# Patient Record
Sex: Female | Born: 1965
Health system: Southern US, Community
[De-identification: ages and names within clinical notes are randomized; demographics above are authoritative.]

## PROBLEM LIST (undated history)

## (undated) DIAGNOSIS — T7840XA Allergy, unspecified, initial encounter: Secondary | ICD-10-CM

## (undated) DIAGNOSIS — I341 Nonrheumatic mitral (valve) prolapse: Secondary | ICD-10-CM

## (undated) DIAGNOSIS — R011 Cardiac murmur, unspecified: Secondary | ICD-10-CM

## (undated) DIAGNOSIS — K219 Gastro-esophageal reflux disease without esophagitis: Secondary | ICD-10-CM

## (undated) HISTORY — DX: Cardiac murmur, unspecified: R01.1

## (undated) HISTORY — PX: BREAST SURGERY: SHX581

## (undated) HISTORY — DX: Gastro-esophageal reflux disease without esophagitis: K21.9

## (undated) HISTORY — DX: Nonrheumatic mitral (valve) prolapse: I34.1

## (undated) HISTORY — DX: Allergy, unspecified, initial encounter: T78.40XA

---

## 1998-05-25 ENCOUNTER — Encounter: Admission: RE | Admit: 1998-05-25 | Discharge: 1998-08-23 | Payer: Self-pay | Admitting: Family Medicine

## 1998-07-02 ENCOUNTER — Other Ambulatory Visit: Admission: RE | Admit: 1998-07-02 | Discharge: 1998-07-02 | Payer: Self-pay | Admitting: Obstetrics & Gynecology

## 1999-03-03 ENCOUNTER — Inpatient Hospital Stay (HOSPITAL_COMMUNITY): Admission: AD | Admit: 1999-03-03 | Discharge: 1999-03-05 | Payer: Self-pay | Admitting: Family Medicine

## 1999-10-21 ENCOUNTER — Other Ambulatory Visit: Admission: RE | Admit: 1999-10-21 | Discharge: 1999-10-21 | Payer: Self-pay | Admitting: Obstetrics & Gynecology

## 2001-03-05 ENCOUNTER — Other Ambulatory Visit: Admission: RE | Admit: 2001-03-05 | Discharge: 2001-03-05 | Payer: Self-pay | Admitting: Obstetrics & Gynecology

## 2002-03-28 ENCOUNTER — Other Ambulatory Visit: Admission: RE | Admit: 2002-03-28 | Discharge: 2002-03-28 | Payer: Self-pay | Admitting: Obstetrics & Gynecology

## 2003-05-12 ENCOUNTER — Other Ambulatory Visit: Admission: RE | Admit: 2003-05-12 | Discharge: 2003-05-12 | Payer: Self-pay | Admitting: Obstetrics & Gynecology

## 2004-08-15 ENCOUNTER — Other Ambulatory Visit: Admission: RE | Admit: 2004-08-15 | Discharge: 2004-08-15 | Payer: Self-pay | Admitting: Obstetrics & Gynecology

## 2006-07-03 ENCOUNTER — Inpatient Hospital Stay (HOSPITAL_COMMUNITY): Admission: AD | Admit: 2006-07-03 | Discharge: 2006-07-05 | Payer: Self-pay | Admitting: Obstetrics & Gynecology

## 2012-12-27 ENCOUNTER — Ambulatory Visit (INDEPENDENT_AMBULATORY_CARE_PROVIDER_SITE_OTHER): Payer: PRIVATE HEALTH INSURANCE | Admitting: Family Medicine

## 2012-12-27 ENCOUNTER — Encounter: Payer: Self-pay | Admitting: Family Medicine

## 2012-12-27 VITALS — BP 102/70 | HR 68 | Temp 97.7°F | Ht 69.0 in | Wt 167.8 lb

## 2012-12-27 DIAGNOSIS — R5381 Other malaise: Secondary | ICD-10-CM

## 2012-12-27 DIAGNOSIS — M25551 Pain in right hip: Secondary | ICD-10-CM

## 2012-12-27 DIAGNOSIS — G43909 Migraine, unspecified, not intractable, without status migrainosus: Secondary | ICD-10-CM

## 2012-12-27 DIAGNOSIS — M25559 Pain in unspecified hip: Secondary | ICD-10-CM

## 2012-12-27 DIAGNOSIS — R5383 Other fatigue: Secondary | ICD-10-CM

## 2012-12-27 DIAGNOSIS — Z13220 Encounter for screening for lipoid disorders: Secondary | ICD-10-CM

## 2012-12-27 DIAGNOSIS — M25552 Pain in left hip: Secondary | ICD-10-CM

## 2012-12-27 LAB — POCT CBC
Granulocyte percent: 62.4 % (ref 37–80)
HCT, POC: 38.4 % (ref 37.7–47.9)
Hemoglobin: 13.2 g/dL (ref 12.2–16.2)
Lymph, poc: 2.5 (ref 0.6–3.4)
MCH, POC: 30 pg (ref 27–31.2)
MCHC: 34.3 g/dL (ref 31.8–35.4)
MCV: 87.6 fL (ref 80–97)
MPV: 7.7 fL (ref 0–99.8)
POC Granulocyte: 5 (ref 2–6.9)
POC LYMPH PERCENT: 30.8 % (ref 10–50)
Platelet Count, POC: 249 K/uL (ref 142–424)
RBC: 4.4 M/uL (ref 4.04–5.48)
RDW, POC: 12.7 %
WBC: 8 K/uL (ref 4.6–10.2)

## 2012-12-27 LAB — COMPLETE METABOLIC PANEL WITH GFR
ALT: 14 U/L (ref 0–35)
AST: 14 U/L (ref 0–37)
Albumin: 4.5 g/dL (ref 3.5–5.2)
Alkaline Phosphatase: 48 U/L (ref 39–117)
BUN: 12 mg/dL (ref 6–23)
CO2: 29 mEq/L (ref 19–32)
Calcium: 9.3 mg/dL (ref 8.4–10.5)
Chloride: 102 mEq/L (ref 96–112)
Creat: 0.73 mg/dL (ref 0.50–1.10)
GFR, Est African American: >89 mL/min
GFR, Est Non African American: >89 mL/min
Glucose, Bld: 92 mg/dL (ref 70–99)
Potassium: 4.2 mEq/L (ref 3.5–5.3)
Sodium: 140 mEq/L (ref 135–145)
Total Bilirubin: 0.6 mg/dL (ref 0.3–1.2)
Total Protein: 6.7 g/dL (ref 6.0–8.3)

## 2012-12-27 LAB — LIPID PANEL
Cholesterol: 144 mg/dL (ref 0–200)
HDL: 46 mg/dL
LDL Cholesterol: 83 mg/dL (ref 0–99)
Total CHOL/HDL Ratio: 3.1 ratio
Triglycerides: 77 mg/dL
VLDL: 15 mg/dL (ref 0–40)

## 2012-12-27 LAB — TSH: TSH: 1.612 u[IU]/mL (ref 0.350–4.500)

## 2012-12-27 NOTE — Progress Notes (Signed)
Patient ID: Brooke Cummings, female   DOB: 04-Apr-1966, 47 y.o.   MRN: [id number] SUBJECTIVE: Chief Complaint  Patient presents with  . Follow-up    wants labs fasting   HPI: [facility] does her regular check ups. Came for  A check up. Has her mammogram 10/2012. Has 4 children, busy, but fatigued , would like her labs checked. Also has recurring hip soreness since she has had her 4 babies. Especially when she runs or wlks a lot. Exercise is  Some running and walking.   PMH/PSH: reviewed/updated in Epic  SH/FH: reviewed/updated in Epic  Allergies: reviewed/updated in Epic  Medications: reviewed/updated in Epic  Immunizations: reviewed/updated in Epic  ROS: As above in the HPI. All other systems are stable or negative.  OBJECTIVE: APPEARANCE:  Patient in no acute distress.The patient appeared well nourished and normally developed. Acyanotic. Waist: VITAL SIGNS:BP 102/70  Pulse 68  Temp(Src) 97.7 F (36.5 C)  Ht 5\' 9"  (1.753 m)  Wt 167 lb 12.8 oz (76.114 kg)  BMI 24.77 kg/m2  LMP 11/26/2012 WF   SKIN: warm and  Dry without overt rashes, tattoos and scars  HEAD and Neck: without JVD, Head and scalp: normal Eyes:No scleral icterus. Fundi normal, eye movements normal. Ears: Auricle normal, canal normal, Tympanic membranes normal, insufflation normal. Nose: normal Throat: normal Neck & thyroid: normal  CHEST & LUNGS: Chest wall: normal Lungs: Clear  CVS: Reveals the PMI to be normally located. Regular rhythm, First and Second Heart sounds are normal,  absence of murmurs, rubs or gallops. Peripheral vasculature: Radial pulses: normal Dorsal pedis pulses: normal Posterior pulses: normal  ABDOMEN:  Appearance: normal Benign, no organomegaly, no masses, no Abdominal Aortic enlargement. No Guarding , no rebound. No Bruits. Bowel sounds: normal  RECTAL: N/A GU: N/A  EXTREMETIES: nonedematous. Both Femoral and Pedal pulses are  normal.  MUSCULOSKELETAL:  Spine: normal Joints: intact  NEUROLOGIC: oriented to time,place and person; nonfocal. Strength is normal Sensory is normal Reflexes are normal Cranial Nerves are normal.  ASSESSMENT: Migraines  Screening for cholesterol level - Plan: Lipid panel  Hip pain, bilateral - Plan: POCT CBC  Other malaise and fatigue - Plan: COMPLETE METABOLIC PANEL WITH GFR, TSH, Vitamin D 25 hydroxy  PLAN: Orders Placed This Encounter  Procedures  . COMPLETE METABOLIC PANEL WITH GFR  . TSH  . Lipid panel  . Vitamin D 25 hydroxy  . POCT CBC   No orders of the defined types were placed in this encounter.   Results for orders placed in visit on 12/27/12  POCT CBC      Result Value Range   WBC 8.0  4.6 - 10.2 K/uL   Lymph, poc 2.5  0.6 - 3.4   POC LYMPH PERCENT 30.8  10 - 50 %L   POC Granulocyte 5.0  2 - 6.9   Granulocyte percent 62.4  37 - 80 %G   RBC 4.4  4.04 - 5.48 M/uL   Hemoglobin 13.2  12.2 - 16.2 g/dL   HCT, POC 08.6  57.8 - 47.9 %   MCV 87.6  80 - 97 fL   MCH, POC 30.0  27 - 31.2 pg   MCHC 34.3  31.8 - 35.4 g/dL   RDW, POC 46.9     Platelet Count, POC 249.0  142 - 424 K/uL   MPV 7.7  0 - 99.8 fL   Return if symptoms worsen or fail to improve.   Prescott Truex P. [place] Charon, M.D.

## 2012-12-28 LAB — VITAMIN D 25 HYDROXY (VIT D DEFICIENCY, FRACTURES): Vit D, 25-Hydroxy: 41 ng/mL (ref 30–89)

## 2012-12-28 NOTE — Progress Notes (Signed)
Quick Note:  Call patient. Labs normal. No change in plan. ______ 

## 2013-05-29 ENCOUNTER — Other Ambulatory Visit: Payer: Self-pay

## 2015-02-04 ENCOUNTER — Ambulatory Visit: Payer: PRIVATE HEALTH INSURANCE | Admitting: Physician Assistant

## 2015-02-09 ENCOUNTER — Encounter: Payer: Self-pay | Admitting: Physician Assistant

## 2015-02-09 ENCOUNTER — Ambulatory Visit (INDEPENDENT_AMBULATORY_CARE_PROVIDER_SITE_OTHER): Payer: PRIVATE HEALTH INSURANCE | Admitting: Physician Assistant

## 2015-02-09 VITALS — BP 105/76 | HR 67 | Temp 97.3°F | Ht 69.0 in | Wt 176.8 lb

## 2015-02-09 DIAGNOSIS — Z0000 Encounter for general adult medical examination without abnormal findings: Secondary | ICD-10-CM

## 2015-02-09 NOTE — Progress Notes (Signed)
Subjective:     Patient ID: Brooke Cummings, female   DOB: 17-Jul-1966, 49 y.o.   MRN: [id number]  HPI Pt here for general check up She has yrly Gyn exam At last visit they asked about lipids etc Not seen here for 2 yrs + regular dental visits Does not exercise on a regular basis No worries/concerns States FH is very good with both parents active  Review of Systems  Constitutional: Negative.   HENT: Negative.   Respiratory: Negative.   Cardiovascular: Negative.   Gastrointestinal: Negative.   Endocrine: Negative.   Genitourinary: Negative.   Musculoskeletal: Negative.   Skin: Negative.   Neurological: Negative.   Hematological: Negative.   Psychiatric/Behavioral: Negative.        Objective:   Physical Exam  Constitutional: She appears well-developed and well-nourished.  HENT:  Head: Normocephalic.  Right Ear: External ear normal.  Left Ear: External ear normal.  Mouth/Throat: Oropharynx is clear and moist. No oropharyngeal exudate.  Eyes: EOM are normal. Pupils are equal, round, and reactive to light.  Neck: Normal range of motion. Neck supple. No JVD present.  Cardiovascular: Normal rate, regular rhythm, normal heart sounds and intact distal pulses.   Pulmonary/Chest: Effort normal and breath sounds normal.  Musculoskeletal: Normal range of motion.  Lymphadenopathy:    She has no cervical adenopathy.  Nursing note and vitals reviewed.      Assessment:     General medical exam    Plan:     Continue with regular Gyn checks Try to increase exercise in to regular activities She is fasting today so full labs done- will inform of results F/U pending labs

## 2015-02-09 NOTE — Patient Instructions (Signed)
Exercise to Stay Healthy Exercise helps you become and stay healthy. EXERCISE IDEAS AND TIPS Choose exercises that:  You enjoy.  Fit into your day. You do not need to exercise really hard to be healthy. You can do exercises at a slow or medium level and stay healthy. You can:  Stretch before and after working out.  Try yoga, Pilates, or tai chi.  Lift weights.  Walk fast, swim, jog, run, climb stairs, bicycle, dance, or rollerskate.  Take aerobic classes. Exercises that burn about 150 calories:  Running 1  miles in 15 minutes.  Playing volleyball for 45 to 60 minutes.  Washing and waxing a car for 45 to 60 minutes.  Playing touch football for 45 minutes.  Walking 1  miles in 35 minutes.  Pushing a stroller 1  miles in 30 minutes.  Playing basketball for 30 minutes.  Raking leaves for 30 minutes.  Bicycling 5 miles in 30 minutes.  Walking 2 miles in 30 minutes.  Dancing for 30 minutes.  Shoveling snow for 15 minutes.  Swimming laps for 20 minutes.  Walking up stairs for 15 minutes.  Bicycling 4 miles in 15 minutes.  Gardening for 30 to 45 minutes.  Jumping rope for 15 minutes.  Washing windows or floors for 45 to 60 minutes. Document Released: 08/12/2010 Document Revised: 10/02/2011 Document Reviewed: 08/12/2010 Exit[employer] Patient Information 2015 [employer]. This information is not intended to replace advice given to you by your health care provider. Make sure you discuss any questions you have with your health care provider.  

## 2015-02-10 LAB — CMP14+EGFR
ALBUMIN: 4.3 g/dL (ref 3.5–5.5)
ALT: 8 IU/L (ref 0–32)
AST: 12 IU/L (ref 0–40)
Albumin/Globulin Ratio: 2 (ref 1.1–2.5)
Alkaline Phosphatase: 49 IU/L (ref 39–117)
BUN/Creatinine Ratio: 19 (ref 9–23)
BUN: 15 mg/dL (ref 6–24)
Bilirubin Total: 0.3 mg/dL (ref 0.0–1.2)
CALCIUM: 9 mg/dL (ref 8.7–10.2)
CHLORIDE: 101 mmol/L (ref 97–108)
CO2: 28 mmol/L (ref 18–29)
Creatinine, Ser: 0.78 mg/dL (ref 0.57–1.00)
GFR calc Af Amer: 105 mL/min/{1.73_m2} (ref >59)
GFR, EST NON AFRICAN AMERICAN: 91 mL/min/{1.73_m2} (ref >59)
Globulin, Total: 2.1 g/dL (ref 1.5–4.5)
Glucose: 90 mg/dL (ref 65–99)
POTASSIUM: 4.7 mmol/L (ref 3.5–5.2)
SODIUM: 141 mmol/L (ref 134–144)
Total Protein: 6.4 g/dL (ref 6.0–8.5)

## 2015-02-10 LAB — LIPID PANEL
CHOL/HDL RATIO: 3.2 ratio (ref 0.0–4.4)
CHOLESTEROL TOTAL: 147 mg/dL (ref 100–199)
HDL: 46 mg/dL (ref >39)
LDL Calculated: 83 mg/dL (ref 0–99)
TRIGLYCERIDES: 92 mg/dL (ref 0–149)
VLDL Cholesterol Cal: 18 mg/dL (ref 5–40)

## 2016-12-11 ENCOUNTER — Other Ambulatory Visit: Payer: Self-pay | Admitting: Obstetrics & Gynecology

## 2016-12-11 DIAGNOSIS — R928 Other abnormal and inconclusive findings on diagnostic imaging of breast: Secondary | ICD-10-CM

## 2016-12-15 ENCOUNTER — Other Ambulatory Visit: Payer: Self-pay | Admitting: Obstetrics & Gynecology

## 2016-12-15 ENCOUNTER — Ambulatory Visit
Admission: RE | Admit: 2016-12-15 | Discharge: 2016-12-15 | Disposition: A | Discharge status: Home / Self Care | Payer: PRIVATE HEALTH INSURANCE | Source: Ambulatory Visit | Attending: Obstetrics & Gynecology | Admitting: Obstetrics & Gynecology

## 2016-12-15 DIAGNOSIS — N6320 Unspecified lump in the left breast, unspecified quadrant: Secondary | ICD-10-CM

## 2016-12-15 DIAGNOSIS — R928 Other abnormal and inconclusive findings on diagnostic imaging of breast: Secondary | ICD-10-CM

## 2016-12-22 ENCOUNTER — Ambulatory Visit
Admission: RE | Admit: 2016-12-22 | Discharge: 2016-12-22 | Disposition: A | Discharge status: Home / Self Care | Payer: PRIVATE HEALTH INSURANCE | Source: Ambulatory Visit | Attending: Obstetrics & Gynecology | Admitting: Obstetrics & Gynecology

## 2016-12-22 ENCOUNTER — Other Ambulatory Visit: Payer: Self-pay | Admitting: Obstetrics & Gynecology

## 2016-12-22 DIAGNOSIS — N6320 Unspecified lump in the left breast, unspecified quadrant: Secondary | ICD-10-CM

## 2017-07-10 ENCOUNTER — Ambulatory Visit: Payer: PRIVATE HEALTH INSURANCE | Admitting: Family Medicine

## 2017-07-10 ENCOUNTER — Encounter: Payer: Self-pay | Admitting: Family Medicine

## 2017-07-10 VITALS — BP 135/87 | HR 80 | Temp 97.0°F | Ht 69.0 in | Wt 182.2 lb

## 2017-07-10 DIAGNOSIS — Z1211 Encounter for screening for malignant neoplasm of colon: Secondary | ICD-10-CM

## 2017-07-10 DIAGNOSIS — E663 Overweight: Secondary | ICD-10-CM | POA: Diagnosis not present

## 2017-07-10 DIAGNOSIS — J302 Other seasonal allergic rhinitis: Secondary | ICD-10-CM | POA: Diagnosis not present

## 2017-07-10 IMAGING — US US BREAST BX W LOC DEV 1ST LESION IMG BX SPEC US GUIDE*L*
1 series · 11 of 11 positions shown · non-contrast
Comparison: Previous exam(s).

ADDENDUM:
Pathology revealed FIBROADENOMA of the Left breast, 8:30 o'clock.
This was found to be concordant by Dr. Joaquim Francisco Paiza. Pathology
results were discussed with the patient by telephone. The patient
reported doing well after the biopsy with tenderness at the site.
Post biopsy instructions and care were reviewed and questions were
answered. The patient was encouraged to call The [REDACTED] of
instructed to return for annual screening mammography at [REDACTED] OBGYN in [HOSPITAL][HOSPITAL].

Pathology results reported by Kacey Azucena, RN on 12/25/2016.
CLINICAL DATA: Patient presents for ultrasound-guided core biopsy
of mass in the left breast.
EXAM:
ULTRASOUND GUIDED LEFT BREAST CORE NEEDLE BIOPSY

[Series 1: us breast bx w loc dev 1st lesion img bx spec us g · 0.07mm/px · 11 of 11 slices shown]
[im 1/11]
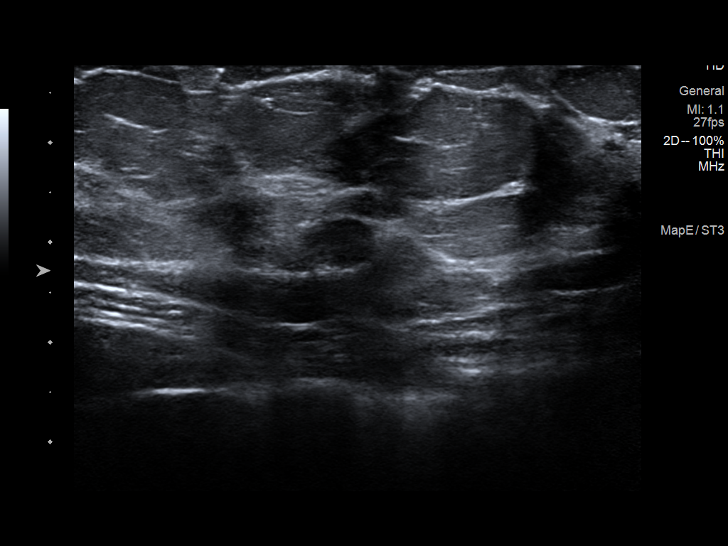
[im 2/11]
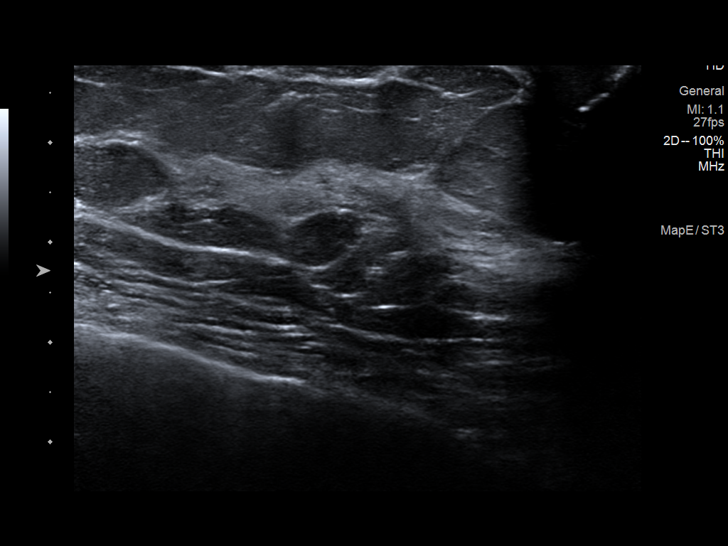
[im 3/11]
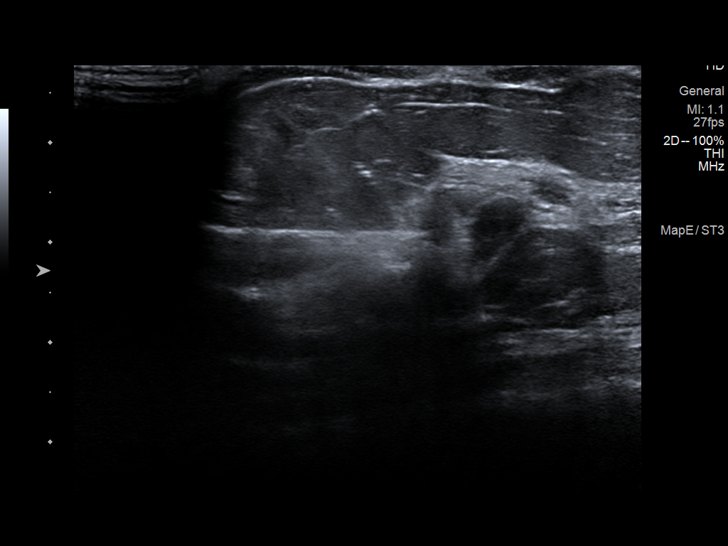
[im 4/11]
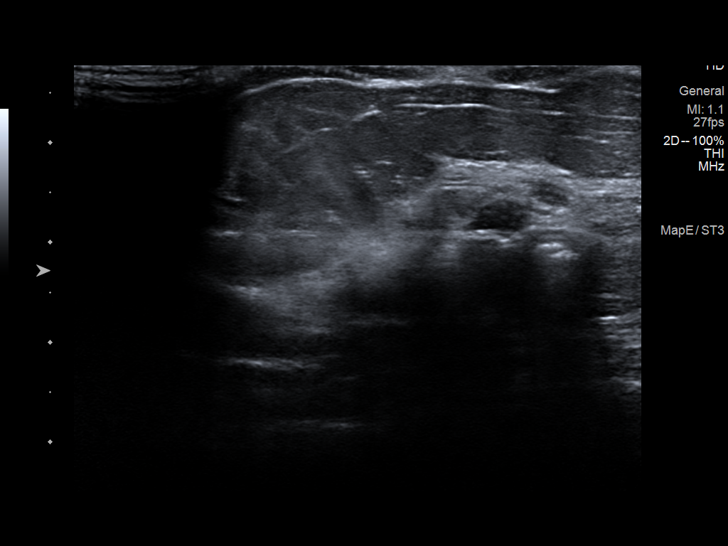
[im 5/11]
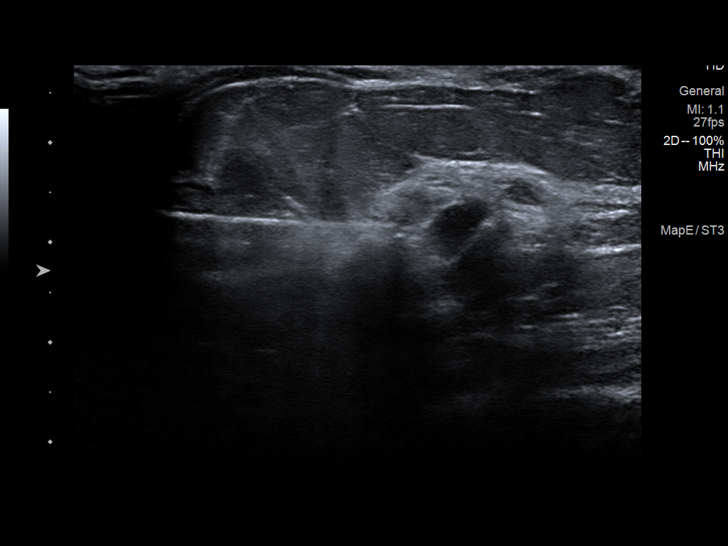
[im 6/11]
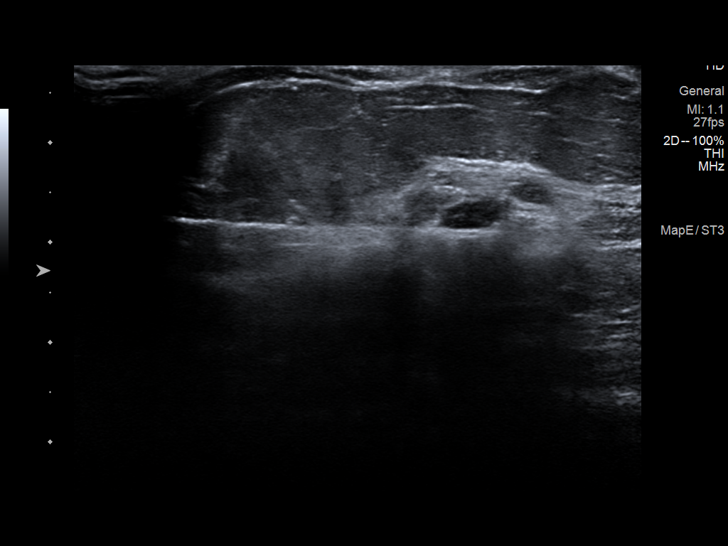
[im 7/11]
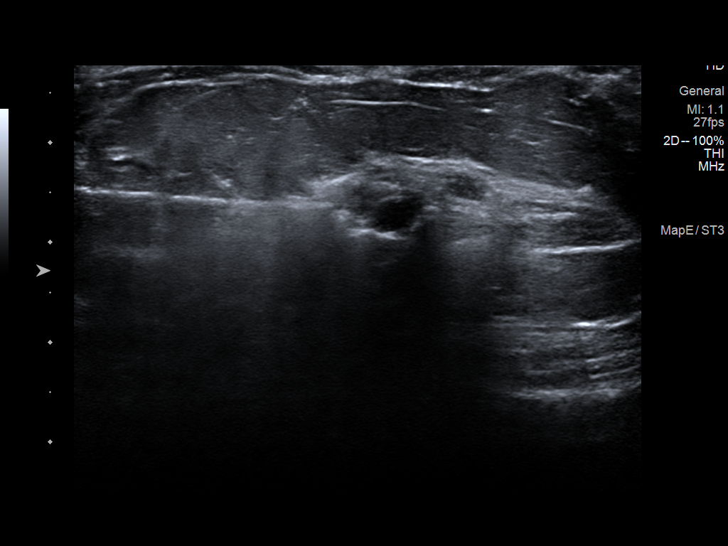
[im 8/11]
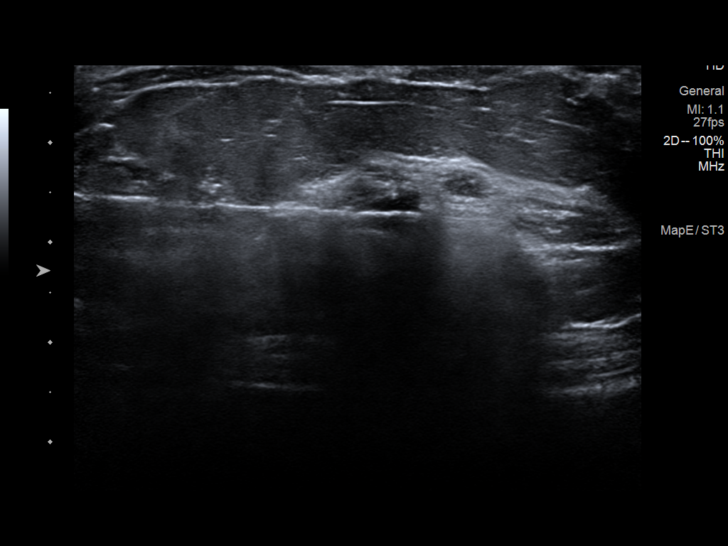
[im 9/11]
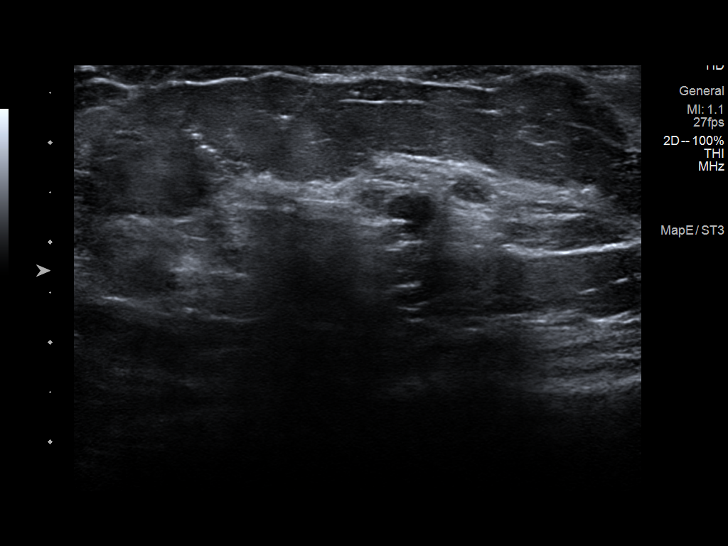
[im 10/11]
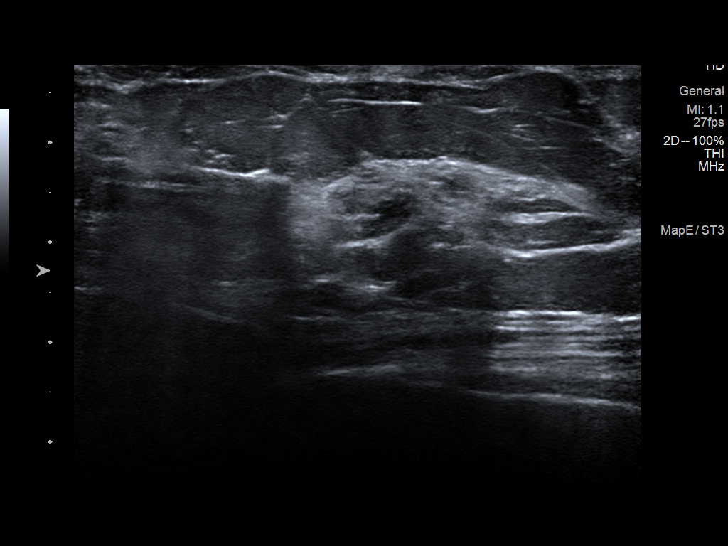
[im 11/11]
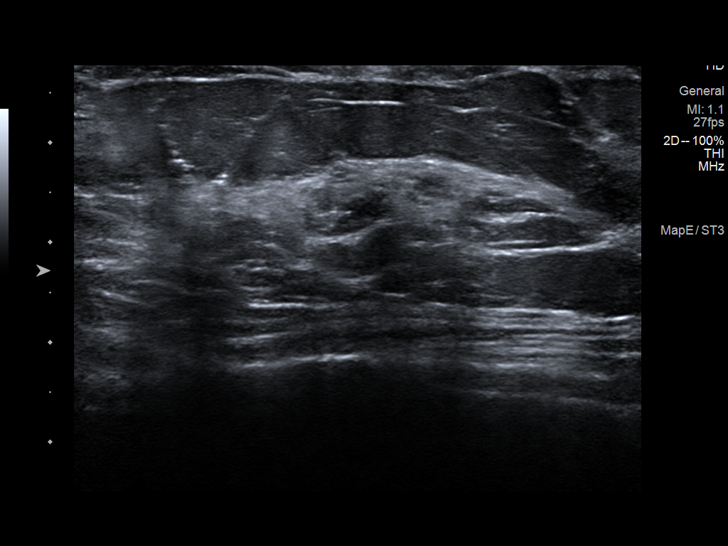

[11 of 11 positions shown; findings below may reference images not displayed]



Lesion quadrant: Lower inner quadrant left breast

Using sterile technique and 1% Lidocaine as local anesthetic, under
direct ultrasound visualization, a 12 gauge Per Ivan device was
used to perform biopsy of mass in the 830 o'clock location of the
left breast using a medial approach. At the conclusion of the
procedure a ribbon shaped tissue marker clip was deployed into the
biopsy cavity. Follow up 2 view mammogram was performed and dictated
separately.
IMPRESSION: Ultrasound guided biopsy of left breast mass. No apparent
complications.

## 2017-07-10 NOTE — Progress Notes (Signed)
   HPI  Patient presents today for checkup, also with congestion.  Patient has mild cough and congestion off and on for months.  She states it has been going on now for a couple of weeks.  She had sinus pain this morning which resolved after an hour or 2. She has not been trying her usual allergy medicines. No fever, chills, sweats, no shortness of breath.  She is worried about her weight, she has recently started riding a stationary bike at home.  She is willing to have a colonoscopy  PMH: Smoking status noted ROS: Per HPI  Objective: BP 135/87   Pulse 80   Temp (!) 97 F (36.1 C) (Oral)   Ht 5\' 9"  (1.753 m)   Wt 182 lb 3.2 oz (82.6 kg)   BMI 26.91 kg/m  Gen: NAD, alert, cooperative with exam HEENT: NCAT, oropharynx moist and clear, nares with swollen boggy mucosa CV: RRR, good S1/S2, no murmur Resp: CTABL, no wheezes, non-labored Abd: SNTND, BS present, no guarding or organomegaly Ext: No edema, warm Neuro: Alert and oriented, No gross deficits  Assessment and plan:  #Seasonal allergic rhinitis Recommended nasal saline rinses, Flonase, or daily antihistamine  #Overweight Labs today Discussed   #Pretty ZOX:[id number]:Screening for colon cancer Refer to GI for Cscope   Murtis SinkSam Yvetta Drotar, MD [facility] 07/10/2017, 2:57 PM

## 2017-07-10 NOTE — Patient Instructions (Signed)
Great to see you!  Consider trying a neti pot ( I like the Neil-med sinus rinse bottle best personally), plain zyrtec daily, or flonase.   You will be called to get a colonoscopy arranged.   We will send your labs on mychart within 1 week.   Health Maintenance, Female Adopting a healthy lifestyle and getting preventive care can go a long way to promote health and wellness. Talk with your health care provider about what schedule of regular examinations is right for you. This is a good chance for you to check in with your provider about disease prevention and staying healthy. In between checkups, there are plenty of things you can do on your own. Experts have done a lot of research about which lifestyle changes and preventive measures are most likely to keep you healthy. Ask your health care provider for more information. Weight and diet Eat a healthy diet  Be sure to include plenty of vegetables, fruits, low-fat dairy products, and lean protein.  Do not eat a lot of foods high in solid fats, added sugars, or salt.  Get regular exercise. This is one of the most important things you can do for your health. ? Most adults should exercise for at least 150 minutes each week. The exercise should increase your heart rate and make you sweat (moderate-intensity exercise). ? Most adults should also do strengthening exercises at least twice a week. This is in addition to the moderate-intensity exercise.  Maintain a healthy weight  Body mass index (BMI) is a measurement that can be used to identify possible weight problems. It estimates body fat based on height and weight. Your health care provider can help determine your BMI and help you achieve or maintain a healthy weight.  For females 51 years of age and older: ? A BMI below 18.5 is considered underweight. ? A BMI of 18.5 to 24.9 is normal. ? A BMI of 25 to 29.9 is considered overweight. ? A BMI of 30 and above is considered obese.  Watch  levels of cholesterol and blood lipids  You should start having your blood tested for lipids and cholesterol at 50 years of age, then have this test every 5 years.  You may need to have your cholesterol levels checked more often if: ? Your lipid or cholesterol levels are high. ? You are older than 50 years of age. ? You are at high risk for heart disease.  Cancer screening Lung Cancer  Lung cancer screening is recommended for adults 52-15 years old who are at high risk for lung cancer because of a history of smoking.  A yearly low-dose CT scan of the lungs is recommended for people who: ? Currently smoke. ? Have quit within the past 15 years. ? Have at least a 30-pack-year history of smoking. A pack year is smoking an average of one pack of cigarettes a day for 1 year.  Yearly screening should continue until it has been 15 years since you quit.  Yearly screening should stop if you develop a health problem that would prevent you from having lung cancer treatment.  Breast Cancer  Practice breast self-awareness. This means understanding how your breasts normally appear and feel.  It also means doing regular breast self-exams. Let your health care provider know about any changes, no matter how small.  If you are in your 20s or 30s, you should have a clinical breast exam (CBE) by a health care provider every 1-3 years as part of a  regular health exam.  If you are 40 or older, have a CBE every year. Also consider having a breast X-ray (mammogram) every year.  If you have a family history of breast cancer, talk to your health care provider about genetic screening.  If you are at high risk for breast cancer, talk to your health care provider about having an MRI and a mammogram every year.  Breast cancer gene (BRCA) assessment is recommended for women who have family members with BRCA-related cancers. BRCA-related cancers include: ? Breast. ? Ovarian. ? Tubal. ? Peritoneal  cancers.  Results of the assessment will determine the need for genetic counseling and BRCA1 and BRCA2 testing.  Cervical Cancer Your health care provider may recommend that you be screened regularly for cancer of the pelvic organs (ovaries, uterus, and vagina). This screening involves a pelvic examination, including checking for microscopic changes to the surface of your cervix (Pap test). You may be encouraged to have this screening done every 3 years, beginning at age 86.  For women ages 51-65, health care providers may recommend pelvic exams and Pap testing every 3 years, or they may recommend the Pap and pelvic exam, combined with testing for human papilloma virus (HPV), every 5 years. Some types of HPV increase your risk of cervical cancer. Testing for HPV may also be done on women of any age with unclear Pap test results.  Other health care providers may not recommend any screening for nonpregnant women who are considered low risk for pelvic cancer and who do not have symptoms. Ask your health care provider if a screening pelvic exam is right for you.  If you have had past treatment for cervical cancer or a condition that could lead to cancer, you need Pap tests and screening for cancer for at least 20 years after your treatment. If Pap tests have been discontinued, your risk factors (such as having a new sexual partner) need to be reassessed to determine if screening should resume. Some women have medical problems that increase the chance of getting cervical cancer. In these cases, your health care provider may recommend more frequent screening and Pap tests.  Colorectal Cancer  This type of cancer can be detected and often prevented.  Routine colorectal cancer screening usually begins at 50 years of age and continues through 50 years of age.  Your health care provider may recommend screening at an earlier age if you have risk factors for colon cancer.  Your health care provider may also  recommend using home test kits to check for hidden blood in the stool.  A small camera at the end of a tube can be used to examine your colon directly (sigmoidoscopy or colonoscopy). This is done to check for the earliest forms of colorectal cancer.  Routine screening usually begins at age 32.  Direct examination of the colon should be repeated every 5-10 years through 50 years of age. However, you may need to be screened more often if early forms of precancerous polyps or small growths are found.  Skin Cancer  Check your skin from head to toe regularly.  Tell your health care provider about any new moles or changes in moles, especially if there is a change in a mole's shape or color.  Also tell your health care provider if you have a mole that is larger than the size of a pencil eraser.  Always use sunscreen. Apply sunscreen liberally and repeatedly throughout the day.  Protect yourself by wearing long sleeves, pants,  a wide-brimmed hat, and sunglasses whenever you are outside.  Heart disease, diabetes, and high blood pressure  High blood pressure causes heart disease and increases the risk of stroke. High blood pressure is more likely to develop in: ? People who have blood pressure in the high end of the normal range (130-139/85-89 mm Hg). ? People who are overweight or obese. ? People who are African American.  If you are 28-66 years of age, have your blood pressure checked every 3-5 years. If you are 56 years of age or older, have your blood pressure checked every year. You should have your blood pressure measured twice-once when you are at a hospital or clinic, and once when you are not at a hospital or clinic. Record the average of the two measurements. To check your blood pressure when you are not at a hospital or clinic, you can use: ? An automated blood pressure machine at a pharmacy. ? A home blood pressure monitor.  If you are between 50 years and 73 years old, ask your  health care provider if you should take aspirin to prevent strokes.  Have regular diabetes screenings. This involves taking a blood sample to check your fasting blood sugar level. ? If you are at a normal weight and have a low risk for diabetes, have this test once every three years after 50 years of age. ? If you are overweight and have a high risk for diabetes, consider being tested at a younger age or more often. Preventing infection Hepatitis B  If you have a higher risk for hepatitis B, you should be screened for this virus. You are considered at high risk for hepatitis B if: ? You were born in a country where hepatitis B is common. Ask your health care provider which countries are considered high risk. ? Your parents were born in a high-risk country, and you have not been immunized against hepatitis B (hepatitis B vaccine). ? You have HIV or AIDS. ? You use needles to inject street drugs. ? You live with someone who has hepatitis B. ? You have had sex with someone who has hepatitis B. ? You get hemodialysis treatment. ? You take certain medicines for conditions, including cancer, organ transplantation, and autoimmune conditions.  Hepatitis C  Blood testing is recommended for: ? Everyone born from [birth year] through 1964. ? Anyone with known risk factors for hepatitis C.  Sexually transmitted infections (STIs)  You should be screened for sexually transmitted infections (STIs) including gonorrhea and chlamydia if: ? You are sexually active and are younger than 51 years of age. ? You are older than 50 years of age and your health care provider tells you that you are at risk for this type of infection. ? Your sexual activity has changed since you were last screened and you are at an increased risk for chlamydia or gonorrhea. Ask your health care provider if you are at risk.  If you do not have HIV, but are at risk, it may be recommended that you take a prescription medicine daily to  prevent HIV infection. This is called pre-exposure prophylaxis (PrEP). You are considered at risk if: ? You are sexually active and do not regularly use condoms or know the HIV status of your partner(s). ? You take drugs by injection. ? You are sexually active with a partner who has HIV.  Talk with your health care provider about whether you are at high risk of being infected with HIV. If you choose  to begin PrEP, you should first be tested for HIV. You should then be tested every 3 months for as long as you are taking PrEP. Pregnancy  If you are premenopausal and you may become pregnant, ask your health care provider about preconception counseling.  If you may become pregnant, take 400 to 800 micrograms (mcg) of folic acid every day.  If you want to prevent pregnancy, talk to your health care provider about birth control (contraception). Osteoporosis and menopause  Osteoporosis is a disease in which the bones lose minerals and strength with aging. This can result in serious bone fractures. Your risk for osteoporosis can be identified using a bone density scan.  If you are 76 years of age or older, or if you are at risk for osteoporosis and fractures, ask your health care provider if you should be screened.  Ask your health care provider whether you should take a calcium or vitamin D supplement to lower your risk for osteoporosis.  Menopause may have certain physical symptoms and risks.  Hormone replacement therapy may reduce some of these symptoms and risks. Talk to your health care provider about whether hormone replacement therapy is right for you. Follow these instructions at home:  Schedule regular health, dental, and eye exams.  Stay current with your immunizations.  Do not use any tobacco products including cigarettes, chewing tobacco, or electronic cigarettes.  If you are pregnant, do not drink alcohol.  If you are breastfeeding, limit how much and how often you drink  alcohol.  Limit alcohol intake to no more than 1 drink per day for nonpregnant women. One drink equals 12 ounces of beer, 5 ounces of wine, or 1 ounces of hard liquor.  Do not use street drugs.  Do not share needles.  Ask your health care provider for help if you need support or information about quitting drugs.  Tell your health care provider if you often feel depressed.  Tell your health care provider if you have ever been abused or do not feel safe at home. This information is not intended to replace advice given to you by your health care provider. Make sure you discuss any questions you have with your health care provider. Document Released: 01/23/2011 Document Revised: 12/16/2015 Document Reviewed: 04/13/2015 Elsevier Interactive Patient Education  [employer].

## 2017-07-11 LAB — CBC WITH DIFFERENTIAL/PLATELET
BASOS: 0 %
Basophils Absolute: 0 10*3/uL (ref 0.0–0.2)
EOS (ABSOLUTE): 0.2 10*3/uL (ref 0.0–0.4)
Eos: 3 %
Hematocrit: 36.4 % (ref 34.0–46.6)
Hemoglobin: 12.5 g/dL (ref 11.1–15.9)
Immature Grans (Abs): 0 10*3/uL (ref 0.0–0.1)
Immature Granulocytes: 0 %
LYMPHS ABS: 2 10*3/uL (ref 0.7–3.1)
Lymphs: 31 %
MCH: 30.1 pg (ref 26.6–33.0)
MCHC: 34.3 g/dL (ref 31.5–35.7)
MCV: 88 fL (ref 79–97)
MONOS ABS: 0.4 10*3/uL (ref 0.1–0.9)
Monocytes: 5 %
Neutrophils Absolute: 4.1 10*3/uL (ref 1.4–7.0)
Neutrophils: 61 %
PLATELETS: 271 10*3/uL (ref 150–379)
RBC: 4.15 x10E6/uL (ref 3.77–5.28)
RDW: 13.4 % (ref 12.3–15.4)
WBC: 6.7 10*3/uL (ref 3.4–10.8)

## 2017-07-11 LAB — CMP14+EGFR
A/G RATIO: 2.2 (ref 1.2–2.2)
ALBUMIN: 4.6 g/dL (ref 3.5–5.5)
ALK PHOS: 55 IU/L (ref 39–117)
ALT: 11 IU/L (ref 0–32)
AST: 12 IU/L (ref 0–40)
BUN / CREAT RATIO: 16 (ref 9–23)
BUN: 12 mg/dL (ref 6–24)
Bilirubin Total: <0.2 mg/dL (ref 0.0–1.2)
CO2: 24 mmol/L (ref 20–29)
CREATININE: 0.73 mg/dL (ref 0.57–1.00)
Calcium: 9.1 mg/dL (ref 8.7–10.2)
Chloride: 102 mmol/L (ref 96–106)
GFR calc Af Amer: 111 mL/min/{1.73_m2} (ref >59)
GFR calc non Af Amer: 96 mL/min/{1.73_m2} (ref >59)
GLOBULIN, TOTAL: 2.1 g/dL (ref 1.5–4.5)
Glucose: 101 mg/dL — ABNORMAL HIGH (ref 65–99)
POTASSIUM: 3.8 mmol/L (ref 3.5–5.2)
SODIUM: 143 mmol/L (ref 134–144)
Total Protein: 6.7 g/dL (ref 6.0–8.5)

## 2017-07-11 LAB — LIPID PANEL
CHOLESTEROL TOTAL: 163 mg/dL (ref 100–199)
Chol/HDL Ratio: 3.7 ratio (ref 0.0–4.4)
HDL: 44 mg/dL (ref >39)
LDL CALC: 76 mg/dL (ref 0–99)
TRIGLYCERIDES: 216 mg/dL — AB (ref 0–149)
VLDL CHOLESTEROL CAL: 43 mg/dL — AB (ref 5–40)

## 2017-07-11 LAB — TSH: TSH: 0.798 u[IU]/mL (ref 0.450–4.500)

## 2017-08-10 ENCOUNTER — Encounter: Payer: Self-pay | Admitting: Family Medicine

## 2019-05-31 NOTE — Progress Notes (Signed)
Assessment & Plan:  1. Well adult exam - Preventive care education provided.  - CBC with Differential/Platelet - CMP14+EGFR - Lipid Panel - TSH  2. Overweight (BMI 25.0-29.9) - Exercise encouraged.  - CBC with Differential/Platelet - CMP14+EGFR - Lipid Panel  3. Family history of thyroid disease - TSH   Follow-up: Return in about 1 year (around 06/03/2020) for annual physical.   Hendricks Limes, MSN, APRN, FNP-C [facility]  Subjective:  Patient ID: Brooke Cummings, female    DOB: 29-Dec-1965  Age: 53 y.o. MRN: [id number]  Patient Care Team: Loman Brooklyn, FNP as PCP - General (Family Medicine)   CC:  Chief Complaint  Patient presents with  . Establish Care    HPI Brooke Cummings presents to establish care.   Occupation: Engineer, petroleum at [employer], Marital status: married, Substance use: none Diet: nothing special, Exercise: none Last eye exam: last year Last dental exam: regularly Last colonoscopy: never Last mammogram: 2018 Last pap smear: 2016 Immunizations: Flu Vaccine: declined  Tdap Vaccine: declined  Shingrix Vaccine: declined  DEPRESSION SCREENING PHQ 2/9 Scores 06/04/2019 07/10/2017  PHQ - 2 Score 0 0   No complaints/concerns today.  Review of Systems  Constitutional: Negative for chills, fever, malaise/fatigue and weight loss.  HENT: Negative for congestion, ear discharge, ear pain, nosebleeds, sinus pain, sore throat and tinnitus.   Eyes: Negative for blurred vision, double vision, pain, discharge and redness.  Respiratory: Negative for cough, shortness of breath and wheezing.   Cardiovascular: Positive for leg swelling (ankles). Negative for chest pain and palpitations.  Gastrointestinal: Negative for abdominal pain, constipation, diarrhea, heartburn, nausea and vomiting.  Genitourinary: Negative for dysuria, frequency and urgency.  Musculoskeletal: Negative for myalgias.  Skin: Negative for rash.  Neurological:  Negative for dizziness, seizures, weakness and headaches.  Psychiatric/Behavioral: Negative for depression, substance abuse and suicidal ideas. The patient is not nervous/anxious.     Current Outpatient Medications:  .  aspirin (ASPIRIN 81) 81 MG chewable tablet, Aspir-81, Disp: , Rfl:  .  Calcium 500-100 MG-UNIT CHEW, Calcium 500, Disp: , Rfl:  .  Omega-3 Fatty Acids (FISH OIL PO), Take by mouth., Disp: , Rfl:  .  Potassium Chloride (K+ POTASSIUM PO), Take by mouth., Disp: , Rfl:  .  VITAMIN E PO, vitamin E, Disp: , Rfl:   No Known Allergies  Past Medical History:  Diagnosis Date  . Mitral valve prolapse     History reviewed. No pertinent surgical history.  Family History  Problem Relation Age of Onset  . Hypertension Mother   . Thyroid disease Mother   . Thyroid disease Sister   . Parkinson's disease Paternal Grandmother     Social History   Socioeconomic History  . Marital status: Married    Spouse name: Not on file  . Number of children: Not on file  . Years of education: Not on file  . Highest education level: Not on file  Occupational History  . Not on file  Social Needs  . Financial resource strain: Not on file  . Food insecurity    Worry: Not on file    Inability: Not on file  . Transportation needs    Medical: Not on file    Non-medical: Not on file  Tobacco Use  . Smoking status: Former Smoker    Types: Cigarettes    Quit date: 12/27/2005    Years since quitting: 13.4  . Smokeless tobacco: Never Used  Substance and Sexual Activity  .  Alcohol use: No  . Drug use: No  . Sexual activity: Yes  Lifestyle  . Physical activity    Days per week: Not on file    Minutes per session: Not on file  . Stress: Not on file  Relationships  . Social Herbalist on phone: Not on file    Gets together: Not on file    Attends religious service: Not on file    Active member of club or organization: Not on file    Attends meetings of clubs or  organizations: Not on file    Relationship status: Not on file  . Intimate partner violence    Fear of current or ex partner: Not on file    Emotionally abused: Not on file    Physically abused: Not on file    Forced sexual activity: Not on file  Other Topics Concern  . Not on file  Social History Narrative  . Not on file      Objective:    BP (!) 86/58   Pulse 69   Temp (!) 96.8 F (36 C) (Temporal)   Ht '5\' 9"'$  (1.753 m)   Wt 184 lb 6.4 oz (83.6 kg)   LMP 11/26/2012   SpO2 100%   BMI 27.23 kg/m   Wt Readings from Last 3 Encounters:  06/04/19 184 lb 6.4 oz (83.6 kg)  07/10/17 182 lb 3.2 oz (82.6 kg)  02/09/15 176 lb 12.8 oz (80.2 kg)    Physical Exam Vitals signs reviewed.  Constitutional:      General: She is not in acute distress.    Appearance: Normal appearance. She is overweight. She is not ill-appearing, toxic-appearing or diaphoretic.  HENT:     Head: Normocephalic and atraumatic.     Right Ear: Tympanic membrane, ear canal and external ear normal. There is no impacted cerumen.     Left Ear: Tympanic membrane, ear canal and external ear normal. There is no impacted cerumen.     Nose: Nose normal. No congestion or rhinorrhea.     Mouth/Throat:     Mouth: Mucous membranes are moist.     Pharynx: Oropharynx is clear. No oropharyngeal exudate or posterior oropharyngeal erythema.  Eyes:     General: No scleral icterus.       Right eye: No discharge.        Left eye: No discharge.     Conjunctiva/sclera: Conjunctivae normal.     Pupils: Pupils are equal, round, and reactive to light.  Neck:     Musculoskeletal: Normal range of motion and neck supple. No neck rigidity or muscular tenderness.  Cardiovascular:     Rate and Rhythm: Normal rate and regular rhythm.     Heart sounds: Normal heart sounds. No murmur. No friction rub. No gallop.   Pulmonary:     Effort: Pulmonary effort is normal. No respiratory distress.     Breath sounds: Normal breath sounds. No  stridor. No wheezing, rhonchi or rales.  Abdominal:     General: Abdomen is flat. Bowel sounds are normal. There is no distension.     Palpations: Abdomen is soft. There is no mass.     Tenderness: There is no abdominal tenderness. There is no guarding or rebound.     Hernia: No hernia is present.  Musculoskeletal: Normal range of motion.  Lymphadenopathy:     Cervical: No cervical adenopathy.  Skin:    General: Skin is warm and dry.     Capillary  Refill: Capillary refill takes less than 2 seconds.  Neurological:     General: No focal deficit present.     Mental Status: She is alert and oriented to person, place, and time. Mental status is at baseline.  Psychiatric:        Mood and Affect: Mood normal.        Behavior: Behavior normal.        Thought Content: Thought content normal.        Judgment: Judgment normal.     Lab Results  Component Value Date   TSH 0.798 07/10/2017   Lab Results  Component Value Date   WBC 6.7 07/10/2017   HGB 12.5 07/10/2017   HCT 36.4 07/10/2017   MCV 88 07/10/2017   PLT 271 07/10/2017   Lab Results  Component Value Date   NA 143 07/10/2017   K 3.8 07/10/2017   CO2 24 07/10/2017   GLUCOSE 101 (H) 07/10/2017   BUN 12 07/10/2017   CREATININE 0.73 07/10/2017   BILITOT <0.2 07/10/2017   ALKPHOS 55 07/10/2017   AST 12 07/10/2017   ALT 11 07/10/2017   PROT 6.7 07/10/2017   ALBUMIN 4.6 07/10/2017   CALCIUM 9.1 07/10/2017   Lab Results  Component Value Date   CHOL 163 07/10/2017   Lab Results  Component Value Date   HDL 44 07/10/2017   Lab Results  Component Value Date   LDLCALC 76 07/10/2017   Lab Results  Component Value Date   TRIG 216 (H) 07/10/2017   Lab Results  Component Value Date   CHOLHDL 3.7 07/10/2017

## 2019-06-03 ENCOUNTER — Other Ambulatory Visit: Payer: Self-pay

## 2019-06-04 ENCOUNTER — Ambulatory Visit (INDEPENDENT_AMBULATORY_CARE_PROVIDER_SITE_OTHER): Payer: Self-pay | Admitting: Family Medicine

## 2019-06-04 ENCOUNTER — Encounter: Payer: Self-pay | Admitting: Family Medicine

## 2019-06-04 VITALS — BP 86/58 | HR 69 | Temp 96.8°F | Ht 69.0 in | Wt 184.4 lb

## 2019-06-04 DIAGNOSIS — Z0000 Encounter for general adult medical examination without abnormal findings: Secondary | ICD-10-CM

## 2019-06-04 DIAGNOSIS — Z8349 Family history of other endocrine, nutritional and metabolic diseases: Secondary | ICD-10-CM

## 2019-06-04 DIAGNOSIS — E663 Overweight: Secondary | ICD-10-CM

## 2019-06-04 NOTE — Patient Instructions (Signed)
Edema  Edema is when you have too much fluid in your body or under your skin. Edema may make your legs, feet, and ankles swell up. Swelling is also common in looser tissues, like around your eyes. This is a common condition. It gets more common as you get older. There are many possible causes of edema. Eating too much salt (sodium) and being on your feet or sitting for a long time can cause edema in your legs, feet, and ankles. Hot weather may make edema worse. Edema is usually painless. Your skin may look swollen or shiny. Follow these instructions at home:  Keep the swollen body part raised (elevated) above the level of your heart when you are sitting or lying down.  Do not sit still or stand for a long time.  Do not wear tight clothes. Do not wear garters on your upper legs.  Exercise your legs. This can help the swelling go down.  Wear elastic bandages or support stockings as told by your doctor.  Eat a low-salt (low-sodium) diet to reduce fluid as told by your doctor.  Depending on the cause of your swelling, you may need to limit how much fluid you drink (fluid restriction).  Take over-the-counter and prescription medicines only as told by your doctor. Contact a doctor if:  Treatment is not working.  You have heart, liver, or kidney disease and have symptoms of edema.  You have sudden and unexplained weight gain. Get help right away if:  You have shortness of breath or chest pain.  You cannot breathe when you lie down.  You have pain, redness, or warmth in the swollen areas.  You have heart, liver, or kidney disease and get edema all of a sudden.  You have a fever and your symptoms get worse all of a sudden. Summary  Edema is when you have too much fluid in your body or under your skin.  Edema may make your legs, feet, and ankles swell up. Swelling is also common in looser tissues, like around your eyes.  Raise (elevate) the swollen body part above the level of  your heart when you are sitting or lying down.  Follow your doctor's instructions about diet and how much fluid you can drink (fluid restriction). This information is not intended to replace advice given to you by your health care provider. Make sure you discuss any questions you have with your health care provider. Document Released: 12/27/2007 Document Revised: 07/13/2017 Document Reviewed: 07/28/2016 Elsevier Patient Education  2020 [employer]   Preventive Care 26-52 Years Old, Female Preventive care refers to visits with your health care provider and lifestyle choices that can promote health and wellness. This includes:  A yearly physical exam. This may also be called an annual well check.  Regular dental visits and eye exams.  Immunizations.  Screening for certain conditions.  Healthy lifestyle choices, such as eating a healthy diet, getting regular exercise, not using drugs or products that contain nicotine and tobacco, and limiting alcohol use. What can I expect for my preventive care visit? Physical exam Your health care provider will check your:  Height and weight. This may be used to calculate body mass index (BMI), which tells if you are at a healthy weight.  Heart rate and blood pressure.  Skin for abnormal spots. Counseling Your health care provider may ask you questions about your:  Alcohol, tobacco, and drug use.  Emotional well-being.  Home and relationship well-being.  Sexual activity.  Eating  habits.  Work and work Statistician.  Method of birth control.  Menstrual cycle.  Pregnancy history. What immunizations do I need?  Influenza (flu) vaccine  This is recommended every year. Tetanus, diphtheria, and pertussis (Tdap) vaccine  You may need a Td booster every 10 years. Varicella (chickenpox) vaccine  You may need this if you have not been vaccinated. Zoster (shingles) vaccine  You may need this after age 52. Measles, mumps, and  rubella (MMR) vaccine  You may need at least one dose of MMR if you were born in 1957 or later. You may also need a second dose. Pneumococcal conjugate (PCV13) vaccine  You may need this if you have certain conditions and were not previously vaccinated. Pneumococcal polysaccharide (PPSV23) vaccine  You may need one or two doses if you smoke cigarettes or if you have certain conditions. Meningococcal conjugate (MenACWY) vaccine  You may need this if you have certain conditions. Hepatitis A vaccine  You may need this if you have certain conditions or if you travel or work in places where you may be exposed to hepatitis A. Hepatitis B vaccine  You may need this if you have certain conditions or if you travel or work in places where you may be exposed to hepatitis B. Haemophilus influenzae type b (Hib) vaccine  You may need this if you have certain conditions. Human papillomavirus (HPV) vaccine  If recommended by your health care provider, you may need three doses over 6 months. You may receive vaccines as individual doses or as more than one vaccine together in one shot (combination vaccines). Talk with your health care provider about the risks and benefits of combination vaccines. What tests do I need? Blood tests  Lipid and cholesterol levels. These may be checked every 5 years, or more frequently if you are over 52 years old.  Hepatitis C test.  Hepatitis B test. Screening  Lung cancer screening. You may have this screening every year starting at age 52 if you have a 30-pack-year history of smoking and currently smoke or have quit within the past 15 years.  Colorectal cancer screening. All adults should have this screening starting at age 52 and continuing until age 52. Your health care provider may recommend screening at age 52 if you are at increased risk. You will have tests every 1-10 years, depending on your results and the type of screening test.  Diabetes screening. This  is done by checking your blood sugar (glucose) after you have not eaten for a while (fasting). You may have this done every 1-3 years.  Mammogram. This may be done every 1-2 years. Talk with your health care provider about when you should start having regular mammograms. This may depend on whether you have a family history of breast cancer.  BRCA-related cancer screening. This may be done if you have a family history of breast, ovarian, tubal, or peritoneal cancers.  Pelvic exam and Pap test. This may be done every 3 years starting at age 45. Starting at age 49, this may be done every 5 years if you have a Pap test in combination with an HPV test. Other tests  Sexually transmitted disease (STD) testing.  Bone density scan. This is done to screen for osteoporosis. You may have this scan if you are at high risk for osteoporosis. Follow these instructions at home: Eating and drinking  Eat a diet that includes fresh fruits and vegetables, whole grains, lean protein, and low-fat dairy.  Take vitamin and mineral  supplements as recommended by your health care provider.  Do not drink alcohol if: ? Your health care provider tells you not to drink. ? You are pregnant, may be pregnant, or are planning to become pregnant.  If you drink alcohol: ? Limit how much you have to 0-1 drink a day. ? Be aware of how much alcohol is in your drink. In the [place] one drink equals one 12 oz bottle of beer (355 mL), one 5 oz glass of wine (148 mL), or one 1 oz glass of hard liquor (44 mL). Lifestyle  Take daily care of your teeth and gums.  Stay active. Exercise for at least 30 minutes on 5 or more days each week.  Do not use any products that contain nicotine or tobacco, such as cigarettes, e-cigarettes, and chewing tobacco. If you need help quitting, ask your health care provider.  If you are sexually active, practice safe sex. Use a condom or other form of birth control (contraception) in order to prevent  pregnancy and STIs (sexually transmitted infections).  If told by your health care provider, take low-dose aspirin daily starting at age 22. What's next?  Visit your health care provider once a year for a well check visit.  Ask your health care provider how often you should have your eyes and teeth checked.  Stay up to date on all vaccines. This information is not intended to replace advice given to you by your health care provider. Make sure you discuss any questions you have with your health care provider. Document Released: 08/06/2015 Document Revised: 03/21/2018 Document Reviewed: 03/21/2018 Elsevier Patient Education  2020 [employer].

## 2019-06-05 LAB — CBC WITH DIFFERENTIAL/PLATELET
Basophils Absolute: 0.1 10*3/uL (ref 0.0–0.2)
Basos: 1 %
EOS (ABSOLUTE): 0.9 10*3/uL — ABNORMAL HIGH (ref 0.0–0.4)
Eos: 13 %
Hematocrit: 40.9 % (ref 34.0–46.6)
Hemoglobin: 13.4 g/dL (ref 11.1–15.9)
Immature Grans (Abs): 0 10*3/uL (ref 0.0–0.1)
Immature Granulocytes: 0 %
Lymphocytes Absolute: 2.3 10*3/uL (ref 0.7–3.1)
Lymphs: 31 %
MCH: 29 pg (ref 26.6–33.0)
MCHC: 32.8 g/dL (ref 31.5–35.7)
MCV: 89 fL (ref 79–97)
Monocytes Absolute: 0.5 10*3/uL (ref 0.1–0.9)
Monocytes: 7 %
Neutrophils Absolute: 3.6 10*3/uL (ref 1.4–7.0)
Neutrophils: 48 %
Platelets: 271 10*3/uL (ref 150–450)
RBC: 4.62 x10E6/uL (ref 3.77–5.28)
RDW: 12.3 % (ref 11.7–15.4)
WBC: 7.3 10*3/uL (ref 3.4–10.8)

## 2019-06-05 LAB — CMP14+EGFR
ALT: 26 IU/L (ref 0–32)
AST: 19 IU/L (ref 0–40)
Albumin/Globulin Ratio: 1.9 (ref 1.2–2.2)
Albumin: 3.7 g/dL — ABNORMAL LOW (ref 3.8–4.9)
Alkaline Phosphatase: 55 IU/L (ref 39–117)
BUN/Creatinine Ratio: 23 (ref 9–23)
BUN: 17 mg/dL (ref 6–24)
Bilirubin Total: 0.4 mg/dL (ref 0.0–1.2)
CO2: 28 mmol/L (ref 20–29)
Calcium: 9.1 mg/dL (ref 8.7–10.2)
Chloride: 102 mmol/L (ref 96–106)
Creatinine, Ser: 0.75 mg/dL (ref 0.57–1.00)
GFR calc Af Amer: 106 mL/min/{1.73_m2} (ref >59)
GFR calc non Af Amer: 92 mL/min/{1.73_m2} (ref >59)
Globulin, Total: 2 g/dL (ref 1.5–4.5)
Glucose: 96 mg/dL (ref 65–99)
Potassium: 4.4 mmol/L (ref 3.5–5.2)
Sodium: 139 mmol/L (ref 134–144)
Total Protein: 5.7 g/dL — ABNORMAL LOW (ref 6.0–8.5)

## 2019-06-05 LAB — LIPID PANEL
Chol/HDL Ratio: 3.8 ratio (ref 0.0–4.4)
Cholesterol, Total: 162 mg/dL (ref 100–199)
HDL: 43 mg/dL (ref >39)
LDL Chol Calc (NIH): 101 mg/dL — ABNORMAL HIGH (ref 0–99)
Triglycerides: 98 mg/dL (ref 0–149)
VLDL Cholesterol Cal: 18 mg/dL (ref 5–40)

## 2019-06-05 LAB — TSH: TSH: 1.87 u[IU]/mL (ref 0.450–4.500)

## 2019-08-13 ENCOUNTER — Encounter: Payer: Self-pay | Admitting: Family Medicine

## 2020-03-31 ENCOUNTER — Telehealth: Payer: Self-pay | Admitting: Family Medicine

## 2020-04-23 DIAGNOSIS — Z124 Encounter for screening for malignant neoplasm of cervix: Secondary | ICD-10-CM | POA: Diagnosis not present

## 2020-04-23 DIAGNOSIS — Z1231 Encounter for screening mammogram for malignant neoplasm of breast: Secondary | ICD-10-CM | POA: Diagnosis not present

## 2020-04-23 DIAGNOSIS — Z01419 Encounter for gynecological examination (general) (routine) without abnormal findings: Secondary | ICD-10-CM | POA: Diagnosis not present

## 2021-02-04 DIAGNOSIS — Z1211 Encounter for screening for malignant neoplasm of colon: Secondary | ICD-10-CM | POA: Diagnosis not present

## 2021-02-04 DIAGNOSIS — D128 Benign neoplasm of rectum: Secondary | ICD-10-CM | POA: Diagnosis not present

## 2021-07-06 ENCOUNTER — Ambulatory Visit (INDEPENDENT_AMBULATORY_CARE_PROVIDER_SITE_OTHER): Payer: BC Managed Care – PPO | Admitting: Family Medicine

## 2021-07-06 ENCOUNTER — Encounter: Payer: Self-pay | Admitting: Family Medicine

## 2021-07-06 VITALS — BP 105/64 | HR 76 | Ht 69.0 in | Wt 188.0 lb

## 2021-07-06 DIAGNOSIS — L237 Allergic contact dermatitis due to plants, except food: Secondary | ICD-10-CM

## 2021-07-06 MED ORDER — TRIAMCINOLONE ACETONIDE 0.1 % EX CREA: 1.0000 "application " | TOPICAL_CREAM | Freq: Two times a day (BID) | CUTANEOUS | 0 refills | Status: AC

## 2021-07-06 NOTE — Progress Notes (Signed)
Subjective:  Patient ID: Brooke Cummings, female    DOB: 13-May-1966, 55 y.o.   MRN: [id number]  Patient Care Team: Loman Brooklyn, FNP as PCP - General (Family Medicine)   Chief Complaint:  Rash (Left leg)   HPI: Brooke Cummings is a 55 y.o. female presenting on 07/06/2021 for Rash (Left leg)   Rash This is a new problem. The current episode started in the past 7 days. The affected locations include the left lower leg. The rash is characterized by itchiness and redness. It is unknown if there was an exposure to a precipitant. Pertinent negatives include no anorexia, congestion, cough, diarrhea, eye pain, facial edema, fatigue, fever, joint pain, nail changes, rhinorrhea, shortness of breath, sore throat or vomiting. Past treatments include anti-itch cream. The treatment provided mild relief.     Relevant past medical, surgical, family, and social history reviewed and updated as indicated.  Allergies and medications reviewed and updated. Data reviewed: Chart in Epic.   Past Medical History:  Diagnosis Date   Mitral valve prolapse     History reviewed. No pertinent surgical history.  Social History   Socioeconomic History   Marital status: Married    Spouse name: Not on file   Number of children: Not on file   Years of education: Not on file   Highest education level: Not on file  Occupational History   Not on file  Tobacco Use   Smoking status: Former    Types: Cigarettes    Quit date: 12/27/2005    Years since quitting: 15.5   Smokeless tobacco: Never  Vaping Use   Vaping Use: Every day  Substance and Sexual Activity   Alcohol use: No   Drug use: No   Sexual activity: Yes  Other Topics Concern   Not on file  Social History Narrative   Not on file   Social Determinants of Health   Financial Resource Strain: Not on file  Food Insecurity: Not on file  Transportation Needs: Not on file  Physical Activity: Not on file  Stress: Not on file  Social  Connections: Not on file  Intimate Partner Violence: Not on file    Outpatient Encounter Medications as of 07/06/2021  Medication Sig   Ascorbic Acid (VITAMIN C PO) Take by mouth.   Calcium 500-100 MG-UNIT CHEW Calcium 500   MAGNESIUM PO Take by mouth.   Multiple Vitamins-Minerals (ZINC PO) Take by mouth.   Omega-3 Fatty Acids (FISH OIL PO) Take by mouth.   Potassium Chloride (K+ POTASSIUM PO) Take by mouth.   triamcinolone cream (KENALOG) 0.1 % Apply 1 application topically 2 (two) times daily.   VITAMIN E PO vitamin E   [DISCONTINUED] aspirin (ASPIRIN 81) 81 MG chewable tablet Aspir-81   No facility-administered encounter medications on file as of 07/06/2021.    No Known Allergies  Review of Systems  Constitutional:  Negative for activity change, appetite change, chills, diaphoresis, fatigue, fever and unexpected weight change.  HENT: Negative.  Negative for congestion, rhinorrhea and sore throat.   Eyes: Negative.  Negative for photophobia, pain and visual disturbance.  Respiratory:  Negative for cough, chest tightness and shortness of breath.   Cardiovascular:  Negative for chest pain, palpitations and leg swelling.  Gastrointestinal:  Negative for abdominal pain, anorexia, blood in stool, constipation, diarrhea, nausea and vomiting.  Endocrine: Negative.   Genitourinary:  Negative for decreased urine volume, difficulty urinating, dysuria, frequency and urgency.  Musculoskeletal:  Negative for arthralgias,  joint pain and myalgias.  Skin:  Positive for color change and rash. Negative for nail changes, pallor and wound.  Allergic/Immunologic: Negative.   Neurological:  Negative for dizziness, tremors, seizures, syncope, facial asymmetry, speech difficulty, weakness, light-headedness, numbness and headaches.  Hematological: Negative.   Psychiatric/Behavioral:  Negative for confusion, hallucinations, sleep disturbance and suicidal ideas.   All other systems reviewed and are  negative.      Objective:  BP 105/64    Pulse 76    Ht _0  (1.753 m)    Wt 188 lb (85.3 kg)    LMP 11/26/2012    SpO2 97%    BMI 27.76 kg/m    Wt Readings from Last 3 Encounters:  07/06/21 188 lb (85.3 kg)  06/04/19 184 lb 6.4 oz (83.6 kg)  07/10/17 182 lb 3.2 oz (82.6 kg)    Physical Exam Constitutional:      General: She is not in acute distress.    Appearance: Normal appearance. She is not ill-appearing, toxic-appearing or diaphoretic.  HENT:     Head: Normocephalic.     Nose: Nose normal.     Mouth/Throat:     Mouth: Mucous membranes are moist.  Eyes:     Conjunctiva/sclera: Conjunctivae normal.     Pupils: Pupils are equal, round, and reactive to light.  Cardiovascular:     Rate and Rhythm: Normal rate and regular rhythm.     Heart sounds: Normal heart sounds. No murmur heard.   No friction rub. No gallop.  Pulmonary:     Effort: Pulmonary effort is normal.     Breath sounds: Normal breath sounds. No stridor. No wheezing.  Skin:    General: Skin is warm and dry.     Capillary Refill: Capillary refill takes less than 2 seconds.     Findings: Erythema and rash present. Rash is vesicular.       Neurological:     General: No focal deficit present.     Mental Status: She is alert and oriented to person, place, and time.  Psychiatric:        Mood and Affect: Mood normal.        Behavior: Behavior normal.        Thought Content: Thought content normal.        Judgment: Judgment normal.    Results for orders placed or performed in visit on 06/04/19  CBC with Differential/Platelet  Result Value Ref Range   WBC 7.3 3.4 - 10.8 x10E3/uL   RBC 4.62 3.77 - 5.28 x10E6/uL   Hemoglobin 13.4 11.1 - 15.9 g/dL   Hematocrit 40.9 34.0 - 46.6 %   MCV 89 79 - 97 fL   MCH 29.0 26.6 - 33.0 pg   MCHC 32.8 31.5 - 35.7 g/dL   RDW 12.3 11.7 - 15.4 %   Platelets 271 150 - 450 x10E3/uL   Neutrophils 48 Not Estab. %   Lymphs 31 Not Estab. %   Monocytes 7 Not Estab. %   Eos 13  Not Estab. %   Basos 1 Not Estab. %   Neutrophils Absolute 3.6 1.4 - 7.0 x10E3/uL   Lymphocytes Absolute 2.3 0.7 - 3.1 x10E3/uL   Monocytes Absolute 0.5 0.1 - 0.9 x10E3/uL   EOS (ABSOLUTE) 0.9 (H) 0.0 - 0.4 x10E3/uL   Basophils Absolute 0.1 0.0 - 0.2 x10E3/uL   Immature Granulocytes 0 Not Estab. %   Immature Grans (Abs) 0.0 0.0 - 0.1 x10E3/uL  CMP14+EGFR  Result Value Ref Range  Glucose 96 65 - 99 mg/dL   BUN 17 6 - 24 mg/dL   Creatinine, Ser 0.75 0.57 - 1.00 mg/dL   GFR calc non Af Amer 92 >59 mL/min/1.73   GFR calc Af Amer 106 >59 mL/min/1.73   BUN/Creatinine Ratio 23 9 - 23   Sodium 139 134 - 144 mmol/L   Potassium 4.4 3.5 - 5.2 mmol/L   Chloride 102 96 - 106 mmol/L   CO2 28 20 - 29 mmol/L   Calcium 9.1 8.7 - 10.2 mg/dL   Total Protein 5.7 (L) 6.0 - 8.5 g/dL   Albumin 3.7 (L) 3.8 - 4.9 g/dL   Globulin, Total 2.0 1.5 - 4.5 g/dL   Albumin/Globulin Ratio 1.9 1.2 - 2.2   Bilirubin Total 0.4 0.0 - 1.2 mg/dL   Alkaline Phosphatase 55 39 - 117 IU/L   AST 19 0 - 40 IU/L   ALT 26 0 - 32 IU/L  Lipid Panel  Result Value Ref Range   Cholesterol, Total 162 100 - 199 mg/dL   Triglycerides 98 0 - 149 mg/dL   HDL 43 >39 mg/dL   VLDL Cholesterol Cal 18 5 - 40 mg/dL   LDL Chol Calc (NIH) 101 (H) 0 - 99 mg/dL   Chol/HDL Ratio 3.8 0.0 - 4.4 ratio  TSH  Result Value Ref Range   TSH 1.870 0.450 - 4.500 uIU/mL       Pertinent labs & imaging results that were available during my care of the patient were reviewed by me and considered in my medical decision making.  Assessment & Plan:  Martiza was seen today for rash.  Diagnoses and all orders for this visit:  Poison oak dermatitis Classic poison oak rash to LLE. No indications of anaphylaxis. Depo-Medrol 40 mg IM in office. Triamcinolone cream as prescribed. Symptomatic care discussed in detail. Report any new, worsening, or persistent symptoms.  -     triamcinolone cream (KENALOG) 0.1 %; Apply 1 application topically 2 (two) times  daily.     Continue all other maintenance medications.  Follow up plan: Return if symptoms worsen or fail to improve.   Continue healthy lifestyle choices, including diet (rich in fruits, vegetables, and lean proteins, and low in salt and simple carbohydrates) and exercise (at least 30 minutes of moderate physical activity daily).  Educational handout given for poison ivy dermatitis  The above assessment and management plan was discussed with the patient. The patient verbalized understanding of and has agreed to the management plan. Patient is aware to call the clinic if they develop any new symptoms or if symptoms persist or worsen. Patient is aware when to return to the clinic for a follow-up visit. Patient educated on when it is appropriate to go to the emergency department.   Monia Pouch, FNP-C [facility] [phone]

## 2021-08-02 ENCOUNTER — Telehealth: Payer: Self-pay | Admitting: Family Medicine

## 2021-08-02 NOTE — Telephone Encounter (Signed)
Patient saw M. Rakes, routing to her.

## 2021-08-03 NOTE — Telephone Encounter (Signed)
Pt aware of provider feedback and scheduled for 08/04/21 at 8:35 with Rakes.

## 2021-08-04 ENCOUNTER — Ambulatory Visit (INDEPENDENT_AMBULATORY_CARE_PROVIDER_SITE_OTHER): Payer: BC Managed Care – PPO | Admitting: Family Medicine

## 2021-08-04 ENCOUNTER — Encounter: Payer: Self-pay | Admitting: Family Medicine

## 2021-08-04 VITALS — BP 109/69 | HR 73 | Temp 98.7°F | Ht 69.0 in | Wt 188.0 lb

## 2021-08-04 DIAGNOSIS — L282 Other prurigo: Secondary | ICD-10-CM

## 2021-08-04 MED ORDER — PREDNISONE 10 MG (21) PO TBPK: ORAL_TABLET | ORAL | 0 refills | Status: AC

## 2021-08-04 MED ORDER — FAMOTIDINE 20 MG PO TABS: 20.0000 mg | ORAL_TABLET | Freq: Two times a day (BID) | ORAL | 0 refills | Status: AC

## 2021-08-04 NOTE — Progress Notes (Signed)
Acute Office Visit  Subjective:    Patient ID: Brooke Cummings, female    DOB: 01/23/1966, 56 y.o.   MRN: [id number]  Chief Complaint  Patient presents with   Rash    Patient presents with a rash on the LLE, persistent for the past month. Paitent endorses significant itching of the rash. She trialed a 2 week course of Kenalog cream BID and received 40mg  Depo-Medrol in office on 07/06/21. She reports initial improvement for a couple days followed by worsening of the rash. Patient states she underwent allergy testing as a child with no significant findings.   Patient is in today for pruritic rash.   Past Medical History:  Diagnosis Date   Mitral valve prolapse     History reviewed. No pertinent surgical history.  Family History  Problem Relation Age of Onset   Hypertension Mother    Thyroid disease Mother    Thyroid disease Sister    Parkinson's disease Paternal Grandmother     Social History   Socioeconomic History   Marital status: Married    Spouse name: Not on file   Number of children: Not on file   Years of education: Not on file   Highest education level: Not on file  Occupational History   Not on file  Tobacco Use   Smoking status: Former    Types: Cigarettes    Quit date: 12/27/2005    Years since quitting: 15.6   Smokeless tobacco: Never  Vaping Use   Vaping Use: Every day  Substance and Sexual Activity   Alcohol use: No   Drug use: No   Sexual activity: Yes  Other Topics Concern   Not on file  Social History Narrative   Not on file   Social Determinants of Health   Financial Resource Strain: Not on file  Food Insecurity: Not on file  Transportation Needs: Not on file  Physical Activity: Not on file  Stress: Not on file  Social Connections: Not on file  Intimate Partner Violence: Not on file    Outpatient Medications Prior to Visit  Medication Sig Dispense Refill   Ascorbic Acid (VITAMIN C PO) Take by mouth.     Calcium 500-100 MG-UNIT  CHEW Calcium 500     MAGNESIUM PO Take by mouth.     Multiple Vitamins-Minerals (ZINC PO) Take by mouth.     Omega-3 Fatty Acids (FISH OIL PO) Take by mouth.     Potassium Chloride (K+ POTASSIUM PO) Take by mouth.     triamcinolone cream (KENALOG) 0.1 % Apply 1 application topically 2 (two) times daily. 30 g 0   VITAMIN E PO vitamin E     No facility-administered medications prior to visit.    No Known Allergies  Review of Systems  All other systems reviewed and are negative.     Objective:    Physical Exam Vitals and nursing note reviewed.  Constitutional:      General: She is not in acute distress.    Appearance: Normal appearance. She is not ill-appearing, toxic-appearing or diaphoretic.  HENT:     Head: Normocephalic.     Nose: Nose normal.     Mouth/Throat:     Mouth: Mucous membranes are moist.     Pharynx: Oropharynx is clear.  Eyes:     Conjunctiva/sclera: Conjunctivae normal.  Cardiovascular:     Rate and Rhythm: Regular rhythm.  Pulmonary:     Effort: Pulmonary effort is normal. No respiratory distress.  Breath sounds: Normal breath sounds.  Musculoskeletal:        General: No swelling.     Left lower leg: No edema.  Skin:    General: Skin is warm and dry.     Capillary Refill: Capillary refill takes less than 2 seconds.     Findings: Rash (LLE) present. Rash is macular.       Neurological:     General: No focal deficit present.     Mental Status: She is alert and oriented to person, place, and time.  Psychiatric:        Mood and Affect: Mood normal.        Behavior: Behavior normal.        Thought Content: Thought content normal.        Judgment: Judgment normal.    BP 109/69    Pulse 73    Temp 98.7 F (37.1 C)    Ht 5\' 9"  (1.753 m)    Wt 188 lb (85.3 kg)    LMP 11/26/2012    SpO2 97%    BMI 27.76 kg/m  Wt Readings from Last 3 Encounters:  08/04/21 188 lb (85.3 kg)  07/06/21 188 lb (85.3 kg)  06/04/19 184 lb 6.4 oz (83.6 kg)    Health  Maintenance Due  Topic Date Due   COVID-19 Vaccine (1) Never done   Pneumococcal Vaccine 7619-55 Years old (1 - PCV) Never done   HIV Screening  Never done   Hepatitis C Screening  Never done   TETANUS/TDAP  Never done   COLONOSCOPY (Pts 45-7120yrs Insurance coverage will need to be confirmed)  Never done   Zoster Vaccines- Shingrix (1 of 2) Never done   PAP SMEAR-Modifier  11/21/2017   MAMMOGRAM  12/07/2017   INFLUENZA VACCINE  Never done    There are no preventive care reminders to display for this patient.   Lab Results  Component Value Date   TSH 1.870 06/04/2019   Lab Results  Component Value Date   WBC 7.3 06/04/2019   HGB 13.4 06/04/2019   HCT 40.9 06/04/2019   MCV 89 06/04/2019   PLT 271 06/04/2019   Lab Results  Component Value Date   NA 139 06/04/2019   K 4.4 06/04/2019   CO2 28 06/04/2019   GLUCOSE 96 06/04/2019   BUN 17 06/04/2019   CREATININE 0.75 06/04/2019   BILITOT 0.4 06/04/2019   ALKPHOS 55 06/04/2019   AST 19 06/04/2019   ALT 26 06/04/2019   PROT 5.7 (L) 06/04/2019   ALBUMIN 3.7 (L) 06/04/2019   CALCIUM 9.1 06/04/2019   Lab Results  Component Value Date   CHOL 162 06/04/2019   Lab Results  Component Value Date   HDL 43 06/04/2019   Lab Results  Component Value Date   LDLCALC 101 (H) 06/04/2019   Lab Results  Component Value Date   TRIG 98 06/04/2019   Lab Results  Component Value Date   CHOLHDL 3.8 06/04/2019   No results found for: HGBA1C     Assessment & Plan:  Amil AmenJulia was seen today for rash.  Diagnoses and all orders for this visit:  Pruritic rash -     predniSONE (STERAPRED UNI-PAK 21 TAB) 10 MG (21) TBPK tablet; Use as directed on back of pill pack -     famotidine (PEPCID) 20 MG tablet; Take 1 tablet (20 mg total) by mouth 2 (two) times daily for 14 days. -     Ambulatory referral to Dermatology -  Patient to take continue allergy medicine at home and benadryl for pruritis. Patient aware to report any new or worsening  symptoms.   Return if symptoms worsen or fail to improve.  The above assessment and management plan was discussed with the patient. The patient verbalized understanding of and has agreed to the management plan. Patient is aware to call the clinic if they develop any new symptoms or if symptoms fail to improve or worsen. Patient is aware when to return to the clinic for a follow-up visit. Patient educated on when it is appropriate to go to the emergency department.     Kari Baars, FNP

## 2021-08-26 DIAGNOSIS — Z6827 Body mass index (BMI) 27.0-27.9, adult: Secondary | ICD-10-CM | POA: Diagnosis not present

## 2021-08-26 DIAGNOSIS — Z1231 Encounter for screening mammogram for malignant neoplasm of breast: Secondary | ICD-10-CM | POA: Diagnosis not present

## 2021-08-26 DIAGNOSIS — Z01419 Encounter for gynecological examination (general) (routine) without abnormal findings: Secondary | ICD-10-CM | POA: Diagnosis not present

## 2021-08-26 DIAGNOSIS — I341 Nonrheumatic mitral (valve) prolapse: Secondary | ICD-10-CM | POA: Insufficient documentation

## 2021-09-13 DIAGNOSIS — L658 Other specified nonscarring hair loss: Secondary | ICD-10-CM | POA: Diagnosis not present

## 2021-09-13 DIAGNOSIS — L308 Other specified dermatitis: Secondary | ICD-10-CM | POA: Diagnosis not present

## 2023-10-04 NOTE — Progress Notes (Signed)
 Complete physical exam  Patient: Brooke Cummings   DOB: [birth year]   58 y.o. Female  MRN: [id number]  Subjective:    Chief Complaint  Patient presents with   Annual Exam    Brooke Cummings is a 58 y.o. female who presents today for a complete physical exam. Last seen at this clinic 08/04/2021. She reports consuming a general and low fat diet. Home exercise routine includes walking 2-3 miles daily. Gym/ health club routine includes  3-5 times daily weight training . She generally feels fairly well. She reports sleeping fairly well. She does not have additional problems to discuss today.   Weight gain New Problem Last seen 12/14/20222 was 188 lbs and today 198 lbs she gained 10 lbs in 3 yrs.Reports eating avery heathy diety "fruit vegetables and fruits and mostly cook at home" Only eat fast food rarely. Exercise 3-5 times weekly at the gym, walk 2-3 miles weekly. She is concerns about menopause  insomnia New Problem Reports in the last few years have ben have issues with sleep " it fluctuate between not able to aor staying asleep" on average getting 6-8 hrs of sleep. Bedtime routine "like to read a physical before bed and put it down 15-30 minutes before falling asleep". "Brain can shut. Health Maintenance: Have an appoinmnet with GYN in April for pap and mammogram  Rip hip pain Hip Pain new Problem: Patient complains of right hip pain. Onset of the symptoms was several years ago. Inciting event: none. Current symptoms include  pain with laying down . Associated symptoms: none. Aggravating symptoms:  none . Patient's overall course: intermittent. Patient has had no prior hip problems. Previous visits for this problem: none. Evaluation to date: none.  Treatment to date: none. "Pain is only present while I ma in bed, not interested on taking any medication" advise on heat"  Most recent fall risk assessment:    10/11/2023    8:54 AM  Fall Risk   Falls in the past year? 0  Number falls in past  yr: 0  Injury with Fall? 0  Risk for fall due to : No Fall Risks  Follow up Falls evaluation completed     Most recent depression screenings:    10/11/2023    8:53 AM 08/04/2021    8:38 AM  PHQ 2/9 Scores  PHQ - 2 Score 0 0  PHQ- 9 Score 2 2    Vision:Within last year, Dental: No current dental problems, and STD: The patient denies history of sexually transmitted disease.  Patient Active Problem List   Diagnosis Date Noted   Annual physical exam 10/11/2023   Chronic right hip pain 10/11/2023   Mitral valve prolapse 08/26/2021   Overweight (BMI 25.0-29.9) 07/10/2017   Past Medical History:  Diagnosis Date   Allergy    Seasonal   GERD (gastroesophageal reflux disease)    Heart murmur 1986   Mitral valve prolapse   Mitral valve prolapse    Past Surgical History:  Procedure Laterality Date   BREAST SURGERY     Biopsy   Social History   Tobacco Use   Smoking status: Former    Types: E-cigarettes   Smokeless tobacco: Never  Vaping Use   Vaping status: Every Day  Substance Use Topics   Alcohol use: No   Drug use: No   Social History   Socioeconomic History   Marital status: Married    Spouse name: Not on file   Number of children: Not on  file   Years of education: Not on file   Highest education level: Bachelor's degree (e.g., BA, AB, BS)  Occupational History   Not on file  Tobacco Use   Smoking status: Former    Types: E-cigarettes   Smokeless tobacco: Never  Vaping Use   Vaping status: Every Day  Substance and Sexual Activity   Alcohol use: No   Drug use: No   Sexual activity: Yes  Other Topics Concern   Not on file  Social History Narrative   Not on file   Social Drivers of Health   Financial Resource Strain: Medium Risk (10/10/2023)   Overall Financial Resource Strain (CARDIA)    Difficulty of Paying Living Expenses: Somewhat hard  Food Insecurity: No Food Insecurity (10/10/2023)   Hunger Vital Sign    Worried About Running Out of Food in  the Last Year: Never true    Ran Out of Food in the Last Year: Never true  Transportation Needs: No Transportation Needs (10/10/2023)   PRAPARE - Administrator, Civil Service (Medical): No    Lack of Transportation (Non-Medical): No  Physical Activity: Sufficiently Active (10/10/2023)   Exercise Vital Sign    Days of Exercise per Week: 5 days    Minutes of Exercise per Session: 30 min  Stress: No Stress Concern Present (10/10/2023)   [employer] of Occupational Health - Occupational Stress Questionnaire    Feeling of Stress : Only a little  Social Connections: Socially Integrated (10/10/2023)   Social Connection and Isolation Panel [NHANES]    Frequency of Communication with Friends and Family: More than three times a week    Frequency of Social Gatherings with Friends and Family: Twice a week    Attends Religious Services: More than 4 times per year    Active Member of [employer] or Organizations: Yes    Attends Engineer, structural: More than 4 times per year    Marital Status: Married  Catering manager Violence: Not on file   Family Status  Relation Name Status   Mother Larence Penning Alive   Father  Alive   Daughter  Alive   Son  Alive   Son  Alive   Son  Alive   Sister  Alive   Brother  Alive   MGM  Deceased   MGF  Deceased   PGM  Deceased   PGF  Deceased   Sister  Alive   Sister  Alive  No partnership data on file   Family History  Problem Relation Age of Onset   Hypertension Mother    Thyroid disease Mother    Thyroid disease Sister    Parkinson's disease Paternal Grandmother    No Known Allergies    Patient Care Team: [facility], Dois Davenport, NP as PCP - General (Nurse Practitioner) Ob/Gyn, Nestor Ramp   Outpatient Medications Prior to Visit  Medication Sig   Ascorbic Acid (VITAMIN C PO) Take by mouth. (Patient not taking: Reported on 10/11/2023)   Calcium 500-100 MG-UNIT CHEW Calcium 500 (Patient not taking: Reported on 10/11/2023)    famotidine (PEPCID) 20 MG tablet Take 1 tablet (20 mg total) by mouth 2 (two) times daily for 14 days.   MAGNESIUM PO Take by mouth. (Patient not taking: Reported on 10/11/2023)   Multiple Vitamins-Minerals (ZINC PO) Take by mouth. (Patient not taking: Reported on 10/11/2023)   Omega-3 Fatty Acids (FISH OIL PO) Take by mouth. (Patient not taking: Reported on 10/11/2023)   Potassium  Chloride (K+ POTASSIUM PO) Take by mouth. (Patient not taking: Reported on 10/11/2023)   predniSONE (STERAPRED UNI-PAK 21 TAB) 10 MG (21) TBPK tablet Use as directed on back of pill pack (Patient not taking: Reported on 10/11/2023)   triamcinolone cream (KENALOG) 0.1 % Apply 1 application topically 2 (two) times daily. (Patient not taking: Reported on 10/11/2023)   VITAMIN E PO vitamin E (Patient not taking: Reported on 10/11/2023)   No facility-administered medications prior to visit.    Review of Systems  Constitutional:  Negative for chills and fever.  HENT:  Negative for congestion, ear discharge, ear pain and sore throat.   Eyes:  Negative for pain.  Respiratory:  Negative for cough, shortness of breath and wheezing.   Cardiovascular:  Negative for chest pain and leg swelling.  Gastrointestinal:  Negative for blood in stool, diarrhea, nausea and vomiting.  Musculoskeletal:  Positive for joint pain.       Right hip pain  Skin:  Negative for itching and rash.  Neurological:  Negative for dizziness and headaches.  Psychiatric/Behavioral:  The patient has insomnia.        Still getting 6-8 hrs of sleep   Negative unless indicated in HPI    Objective:     BP 120/74   Pulse 68   Temp (!) 97.5 F (36.4 C) (Temporal)   Ht 5\' 9"  (1.753 m)   Wt 198 lb 6.4 oz (90 kg)   LMP 11/26/2012   SpO2 98%   BMI 29.30 kg/m  BP Readings from Last 3 Encounters:  10/11/23 120/74  08/04/21 109/69  07/06/21 105/64   Wt Readings from Last 3 Encounters:  10/11/23 198 lb 6.4 oz (90 kg)  08/04/21 188 lb (85.3 kg)  07/06/21  188 lb (85.3 kg)      Physical Exam Vitals and nursing note reviewed.  Constitutional:      General: She is not in acute distress.    Appearance: She is overweight.  HENT:     Head: Normocephalic and atraumatic.     Right Ear: Tympanic membrane, ear canal and external ear normal. There is no impacted cerumen.     Left Ear: Tympanic membrane, ear canal and external ear normal. There is no impacted cerumen.     Nose: Nose normal.     Mouth/Throat:     Mouth: Mucous membranes are moist.  Eyes:     General: No scleral icterus.    Extraocular Movements: Extraocular movements intact.     Conjunctiva/sclera: Conjunctivae normal.     Pupils: Pupils are equal, round, and reactive to light.  Neck:     Vascular: No carotid bruit.  Cardiovascular:     Heart sounds: Normal heart sounds.  Pulmonary:     Effort: Pulmonary effort is normal.     Breath sounds: Normal breath sounds.  Abdominal:     General: Bowel sounds are normal.     Palpations: Abdomen is soft.     Tenderness: There is no abdominal tenderness.     Hernia: No hernia is present.  Musculoskeletal:        General: Normal range of motion.     Cervical back: Normal range of motion and neck supple. No rigidity or tenderness.     Right hip: Normal. No tenderness, bony tenderness or crepitus. Normal range of motion. Normal strength.     Left hip: Normal. No tenderness or crepitus. Normal range of motion. Normal strength.     Right lower leg: Normal. No  edema.     Left lower leg: Normal. No edema.  Lymphadenopathy:     Cervical: No cervical adenopathy.  Skin:    General: Skin is warm and dry.     Findings: No rash.  Neurological:     Mental Status: She is oriented to person, place, and time. Mental status is at baseline.  Psychiatric:        Mood and Affect: Mood normal.        Behavior: Behavior normal.        Thought Content: Thought content normal.        Judgment: Judgment normal.      No results found for any  visits on 10/11/23. Last CBC Lab Results  Component Value Date   WBC 7.3 06/04/2019   HGB 13.4 06/04/2019   HCT 40.9 06/04/2019   MCV 89 06/04/2019   MCH 29.0 06/04/2019   RDW 12.3 06/04/2019   PLT 271 06/04/2019   Last metabolic panel Lab Results  Component Value Date   GLUCOSE 96 06/04/2019   NA 139 06/04/2019   K 4.4 06/04/2019   CL 102 06/04/2019   CO2 28 06/04/2019   BUN 17 06/04/2019   CREATININE 0.75 06/04/2019   GFRNONAA 92 06/04/2019   CALCIUM 9.1 06/04/2019   PROT 5.7 (L) 06/04/2019   ALBUMIN 3.7 (L) 06/04/2019   LABGLOB 2.0 06/04/2019   AGRATIO 1.9 06/04/2019   BILITOT 0.4 06/04/2019   ALKPHOS 55 06/04/2019   AST 19 06/04/2019   ALT 26 06/04/2019   Last lipids Lab Results  Component Value Date   CHOL 162 06/04/2019   HDL 43 06/04/2019   LDLCALC 101 (H) 06/04/2019   TRIG 98 06/04/2019   CHOLHDL 3.8 06/04/2019   Last hemoglobin A1c No results found for: "HGBA1C" Last thyroid functions Lab Results  Component Value Date   TSH 1.870 06/04/2019        Assessment & Plan:    Routine Health Maintenance and Physical Exam  Discussed health benefits of physical activity, and encouraged her to engage in regular exercise appropriate for her age and condition.  Overweight (BMI 25.0-29.9) -     Thyroid Profile  Annual physical exam -     CBC -     Comprehensive metabolic panel -     Lipid panel -     Bayer DCA Hb A1c Waived  Chronic right hip pain  Raynelle Fanning is a 58 year old Caucasian female seen today for physical, no acute distress Labs: CBC, CMP, lipid, TSH result pending  Insomnia: Sleep hygiene, over-the-counter melatonin  Right hip pain: Apply heat 15 minutes implement as needed  Overweight: continue diet and exercise  Follow up with Gyn in April as already schedule for pap and mammogram  Continue healthy lifestyle choices, including diet (rich in fruits, vegetables, and lean proteins, and low in salt and simple carbohydrates) and exercise  (at least 30 minutes of moderate physical activity daily).     The above assessment and management plan was discussed with the patient. The patient verbalized understanding of and has agreed to the management plan. Patient is aware to call the clinic if they develop any new symptoms or if symptoms persist or worsen. Patient is aware when to return to the clinic for a follow-up visit. Patient educated on when it is appropriate to go to the emergency department.  Return in about 1 year (around 10/10/2024) for physical.     Arrie Aran Santa Lighter, DNP [facility] [address]  [address] [place], [place] [address] [phone]

## 2023-10-11 ENCOUNTER — Ambulatory Visit: Payer: BC Managed Care – PPO | Admitting: Nurse Practitioner

## 2023-10-11 ENCOUNTER — Encounter: Payer: Self-pay | Admitting: Nurse Practitioner

## 2023-10-11 VITALS — BP 120/74 | HR 68 | Temp 97.5°F | Ht 69.0 in | Wt 198.4 lb

## 2023-10-11 DIAGNOSIS — Z6829 Body mass index (BMI) 29.0-29.9, adult: Secondary | ICD-10-CM

## 2023-10-11 DIAGNOSIS — Z0001 Encounter for general adult medical examination with abnormal findings: Secondary | ICD-10-CM | POA: Diagnosis not present

## 2023-10-11 DIAGNOSIS — E663 Overweight: Secondary | ICD-10-CM

## 2023-10-11 DIAGNOSIS — G8929 Other chronic pain: Secondary | ICD-10-CM

## 2023-10-11 DIAGNOSIS — Z0000 Encounter for general adult medical examination without abnormal findings: Secondary | ICD-10-CM

## 2023-10-11 DIAGNOSIS — M25551 Pain in right hip: Secondary | ICD-10-CM

## 2023-10-11 LAB — BAYER DCA HB A1C WAIVED: HB A1C (BAYER DCA - WAIVED): 5.5 % (ref 4.8–5.6)

## 2023-10-12 LAB — COMPREHENSIVE METABOLIC PANEL
ALT: 13 IU/L (ref 0–32)
AST: 18 IU/L (ref 0–40)
Albumin: 4.3 g/dL (ref 3.8–4.9)
Alkaline Phosphatase: 73 IU/L (ref 44–121)
BUN/Creatinine Ratio: 22 (ref 9–23)
BUN: 15 mg/dL (ref 6–24)
Bilirubin Total: 0.4 mg/dL (ref 0.0–1.2)
CO2: 26 mmol/L (ref 20–29)
Calcium: 9.1 mg/dL (ref 8.7–10.2)
Chloride: 101 mmol/L (ref 96–106)
Creatinine, Ser: 0.69 mg/dL (ref 0.57–1.00)
Globulin, Total: 2.1 g/dL (ref 1.5–4.5)
Glucose: 93 mg/dL (ref 70–99)
Potassium: 4.5 mmol/L (ref 3.5–5.2)
Sodium: 139 mmol/L (ref 134–144)
Total Protein: 6.4 g/dL (ref 6.0–8.5)
eGFR: 102 mL/min/{1.73_m2} (ref >59)

## 2023-10-12 LAB — CBC
Hematocrit: 38.1 % (ref 34.0–46.6)
Hemoglobin: 12.6 g/dL (ref 11.1–15.9)
MCH: 29.6 pg (ref 26.6–33.0)
MCHC: 33.1 g/dL (ref 31.5–35.7)
MCV: 89 fL (ref 79–97)
Platelets: 254 10*3/uL (ref 150–450)
RBC: 4.26 x10E6/uL (ref 3.77–5.28)
RDW: 12.2 % (ref 11.7–15.4)
WBC: 6 10*3/uL (ref 3.4–10.8)

## 2023-10-12 LAB — THYROID PANEL
Free Thyroxine Index: 1.7 (ref 1.2–4.9)
T3 Uptake Ratio: 28 % (ref 24–39)
T4, Total: 6.1 ug/dL (ref 4.5–12.0)

## 2023-10-12 LAB — LIPID PANEL
Chol/HDL Ratio: 3.4 ratio (ref 0.0–4.4)
Cholesterol, Total: 162 mg/dL (ref 100–199)
HDL: 48 mg/dL (ref >39)
LDL Chol Calc (NIH): 101 mg/dL — ABNORMAL HIGH (ref 0–99)
Triglycerides: 66 mg/dL (ref 0–149)
VLDL Cholesterol Cal: 13 mg/dL (ref 5–40)

## 2023-10-26 LAB — HM MAMMOGRAPHY

## 2023-11-27 ENCOUNTER — Encounter: Payer: Self-pay | Admitting: Nurse Practitioner

## 2024-04-04 ENCOUNTER — Encounter: Payer: Self-pay | Admitting: *Deleted

## 2024-08-28 ENCOUNTER — Encounter: Payer: Self-pay | Admitting: *Deleted

## 2024-10-13 ENCOUNTER — Encounter: Admitting: Nurse Practitioner

## 2024-10-14 ENCOUNTER — Encounter: Payer: Self-pay | Admitting: Nurse Practitioner

## 2024-10-14 ENCOUNTER — Encounter: Payer: Self-pay | Admitting: Family
